# Patient Record
Sex: Female | Born: 1985 | Race: White | Hispanic: No | Marital: Married | Smoking: Former smoker
Health system: Southern US, Community
[De-identification: ages and names within clinical notes are randomized; demographics above are authoritative.]

## PROBLEM LIST (undated history)

## (undated) DIAGNOSIS — Z789 Other specified health status: Secondary | ICD-10-CM

## (undated) HISTORY — PX: NO PAST SURGERIES: SHX2092

---
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
## 2009-03-07 ENCOUNTER — Observation Stay: Payer: Self-pay

## 2009-03-07 ENCOUNTER — Inpatient Hospital Stay: Payer: Self-pay | Admitting: Obstetrics & Gynecology

## 2009-05-07 ENCOUNTER — Emergency Department: Payer: Self-pay | Admitting: Emergency Medicine

## 2011-05-14 ENCOUNTER — Ambulatory Visit: Payer: Self-pay | Admitting: Family Medicine

## 2011-07-23 ENCOUNTER — Encounter: Payer: Self-pay | Admitting: Family Medicine

## 2011-07-23 ENCOUNTER — Ambulatory Visit (INDEPENDENT_AMBULATORY_CARE_PROVIDER_SITE_OTHER): Payer: BC Managed Care – PPO | Admitting: Family Medicine

## 2011-07-23 VITALS — BP 100/62 | HR 80 | Temp 98.1°F | Ht 66.0 in | Wt 148.0 lb

## 2011-07-23 DIAGNOSIS — Z8669 Personal history of other diseases of the nervous system and sense organs: Secondary | ICD-10-CM

## 2011-07-23 DIAGNOSIS — F329 Major depressive disorder, single episode, unspecified: Secondary | ICD-10-CM

## 2011-07-23 DIAGNOSIS — F32A Depression, unspecified: Secondary | ICD-10-CM | POA: Insufficient documentation

## 2011-07-23 MED ORDER — BUPROPION HCL ER (XL) 150 MG PO TB24: 150.0000 mg | ORAL_TABLET | ORAL | Status: DC

## 2011-07-23 NOTE — Patient Instructions (Addendum)
Please keep a headache journal. Call me in one month to let me know how the Wellbutrin is working.

## 2011-07-23 NOTE — Progress Notes (Signed)
Subjective:    Patient ID: Brittany Ferguson, female    DOB: 03/14/1985, 26 y.o.   MRN: [id number]  HPI  63 G1P1 yo here to establish care.  Headaches- gets one to two severe headaches per month- behind eyes. Often associated with photophobia and nausea. Never told that she has migraines or any other type of headache.  Usually goes away if she takes some Ibuprofen and goes to sleep.  Depression- has had episodes of what she considers to be depression her entire life.  Has never seen a healthcare provider about them.  She does think she had the baby blues after she delivered her daughter- never wanted to harm herself or her daughter.  Lately, she has less desire to do things she likes to do, less motivated to get out of bed. She has been a little tearful. No anxiety. Sleeping ok. Appetite ok. No SI or HI.  She has been smoking. She and her husband would like to have another child soon.  Patient Active Problem List  Diagnosis  . Depression  . History of migraine headaches   No past medical history on file. No past surgical history on file. History  Substance Use Topics  . Smoking status: Current Everyday Smoker  . Smokeless tobacco: Not on file  . Alcohol Use: Not on file   Family History  Problem Relation Age of Onset  . Depression Mother   . Heart disease Father   . Hyperlipidemia Father   . Hypertension Father    Allergies  Allergen Reactions  . Penicillins Rash    As a child   Current Outpatient Prescriptions on File Prior to Visit  Medication Sig Dispense Refill  . fexofenadine (ALLEGRA) 180 MG tablet Take 180 mg by mouth daily.      Marland Kitchen buPROPion (WELLBUTRIN XL) 150 MG 24 hr tablet Take 1 tablet (150 mg total) by mouth every morning.  30 tablet  6   The PMH, PSH, Social History, Family History, Medications, and allergies have been reviewed in [facility], and have been updated if relevant.    Review of Systems See HPI Patient reports no  vision/ hearing  changes,anorexia, weight change, fever ,adenopathy, persistant / recurrent hoarseness, swallowing issues, chest pain, edema,persistant / recurrent cough, hemoptysis, abnormal bruising/bleeding, major joint swelling, breast masses or abnormal vaginal bleeding.       Objective:   Physical Exam BP 100/62  Pulse 80  Temp 98.1 F (36.7 C)  Ht 5\' 6"  (1.676 m)  Wt 148 lb (67.132 kg)  BMI 23.89 kg/m2  LMP 07/03/2011  General:  Well-developed,well-nourished,in no acute distress; alert,appropriate and cooperative throughout examination Head:  normocephalic and atraumatic.   Eyes:  vision grossly intact, pupils equal, pupils round, and pupils reactive to light.   Ears:  R ear normal and L ear normal.   Nose:  no external deformity.   Mouth:  good dentition.   Lungs:  Normal respiratory effort, chest expands symmetrically. Lungs are clear to auscultation, no crackles or wheezes. Heart:  Normal rate and regular rhythm. S1 and S2 normal without gallop, murmur, click, rub or other extra sounds. Abdomen:  Bowel sounds positive,abdomen soft and non-tender without masses, organomegaly or hernias noted. Msk:  No deformity or scoliosis noted of thoracic or lumbar spine.   Extremities:  No clubbing, cyanosis, edema, or deformity noted with normal full range of motion of all joints.   Neurologic:  alert & oriented X3 and gait normal.   Skin:  Intact without suspicious lesions  or rashes Cervical Nodes:  No lymphadenopathy noted Psych:  Cognition and judgment appear intact. Alert and cooperative with normal attention span and concentration. No apparent delusions, illusions, hallucinations    Assessment & Plan:   1. Depression  Deteriorated. Will start Wellbutrin XL 150 mg daily- discussed that it is a pregnancy risk category C. Hopefully this will help with smoking cessation as well.  Discussed how important it is to quit smoking before she gets pregnant. The patient indicates understanding of these  issues and agrees with the plan.   2. History of migraine headaches  Advised keeping headache journal- look for triggers. She wants to get pregnant so we cannot prescribe triptans. She is not having them frequently enough at this point to warrant prophylactic therapy. The patient indicates understanding of these issues and agrees with the plan.

## 2012-02-02 ENCOUNTER — Encounter: Payer: Self-pay | Admitting: Family Medicine

## 2012-02-02 ENCOUNTER — Ambulatory Visit (INDEPENDENT_AMBULATORY_CARE_PROVIDER_SITE_OTHER): Payer: BC Managed Care – PPO | Admitting: Family Medicine

## 2012-02-02 VITALS — BP 110/60 | HR 86 | Temp 98.2°F | Ht 66.0 in | Wt 142.8 lb

## 2012-02-02 DIAGNOSIS — J020 Streptococcal pharyngitis: Secondary | ICD-10-CM

## 2012-02-02 DIAGNOSIS — J029 Acute pharyngitis, unspecified: Secondary | ICD-10-CM

## 2012-02-02 MED ORDER — AZITHROMYCIN 250 MG PO TABS: ORAL_TABLET | ORAL | Status: DC

## 2012-02-02 NOTE — Progress Notes (Signed)
Nature conservation officer at [facility] [address] [place] [address] Phone: [phone] Fax: [phone]  Date:  02/02/2012   Name:  Brittany Ferguson   DOB:  [birth year]   MRN:  [id number] Gender: female Age: 27 y.o.  Primary Physician:  Ruthe Mannan, MD  Evaluating MD: Hannah Beat, MD   Chief Complaint: Sore Throat, Headache, Fever, Chills and Generalized Body Aches   History of Present Illness:  Brittany Ferguson is a 27 y.o. pleasant patient who presents with the following:  Throat has been hurting her and feels hot and is achy. No real coughing right now. Throat started to hurt on Monday.  No fever, no chills or sweats. No nasal congestion or cough. Some nausea.  Patient Active Problem List  Diagnosis  . Depression  . History of migraine headaches    No past medical history on file.  No past surgical history on file.  History  Substance Use Topics  . Smoking status: Current Every Day Smoker  . Smokeless tobacco: Not on file  . Alcohol Use: Not on file    Family History  Problem Relation Age of Onset  . Depression Mother   . Heart disease Father   . Hyperlipidemia Father   . Hypertension Father     Allergies  Allergen Reactions  . Penicillins Rash    As a child    Medication list has been reviewed and updated.  Outpatient Prescriptions Prior to Visit  Medication Sig Dispense Refill  . buPROPion (WELLBUTRIN XL) 150 MG 24 hr tablet Take 1 tablet (150 mg total) by mouth every morning.  30 tablet  6  . fexofenadine (ALLEGRA) 180 MG tablet Take 180 mg by mouth daily.       Last reviewed on 02/02/2012 11:31 AM by Consuello Masse, CMA  Review of Systems:  ROS: GEN: Acute illness details above GI: Tolerating PO intake GU: maintaining adequate hydration and urination Pulm: No SOB Interactive and getting along well at home.  Otherwise, ROS is as per the HPI.   Physical Examination: BP 110/60  Pulse 86  Temp 98.2 F (36.8 C) (Oral)  Ht 5'  6" (1.676 m)  Wt 142 lb 12 oz (64.751 kg)  BMI 23.04 kg/m2  SpO2 97%  Ideal Body Weight: Weight in (lb) to have BMI = 25: 154.6    Gen: WDWN, NAD; A & O x3, cooperative. Pleasant.Globally Non-toxic HEENT: Normocephalic and atraumatic. Throat: swollen tonsills without exudate R TM clear, L TM - good landmarks, No fluid present. rhinnorhea. No frontal or maxillary sinus T. MMM NECK: Anterior cervical  LAD is present - TTP CV: RRR, No M/G/R, cap refill <2 sec PULM: Breathing comfortably in no respiratory distress. no wheezing, crackles, rhonchi ABD: S,NT,ND,+BS. No HSM. No rebound. EXT: No c/c/e PSYCH: Friendly, good eye contact   Assessment and Plan:  1. Strep throat    2. Sore throat  POCT rapid strep A   PCN allergic  Results for orders placed in visit on 02/02/12  POCT RAPID STREP A (OFFICE)      Component Value Range   Rapid Strep A Screen Positive (*) Negative     Orders Today:  Orders Placed This Encounter  Procedures  . POCT rapid strep A    Updated Medication List: (Includes new medications, updates to list, dose adjustments) Meds ordered this encounter  Medications  . azithromycin (ZITHROMAX) 250 MG tablet    Sig: 2 tabs po on day 1, then 1 tab po  for 4 days    Dispense:  6 tablet    Refill:  0    Medications Discontinued: There are no discontinued medications.   Signed, Elpidio Galea. Seidy Labreck, MD 02/02/2012 12:03 PM

## 2012-06-08 ENCOUNTER — Encounter: Payer: Self-pay | Admitting: Family Medicine

## 2012-06-08 ENCOUNTER — Ambulatory Visit (INDEPENDENT_AMBULATORY_CARE_PROVIDER_SITE_OTHER): Payer: BC Managed Care – PPO | Admitting: Family Medicine

## 2012-06-08 VITALS — BP 110/70 | HR 88 | Temp 97.9°F | Wt 142.0 lb

## 2012-06-08 DIAGNOSIS — J029 Acute pharyngitis, unspecified: Secondary | ICD-10-CM

## 2012-06-08 DIAGNOSIS — J069 Acute upper respiratory infection, unspecified: Secondary | ICD-10-CM

## 2012-06-08 NOTE — Patient Instructions (Addendum)
Good to see you.  Treat sympotmatically with Mucinex, nasal saline irrigation, and Tylenol/Ibuprofen.   Also try an antihistamine claritin or zyrtec.   Call if not improving as expected in 5-7 days.

## 2012-06-08 NOTE — Progress Notes (Signed)
SUBJECTIVE:  Brittany Ferguson is a 27 y.o. female who complains of coryza, congestion, sneezing and sore throat for 2 days. She denies a history of anorexia, chest pain and chills and denies a history of asthma. Patient does smoke cigarettes.  Taking Mucinex OTC. Patient Active Problem List   Diagnosis Date Noted  . Depression 07/23/2011  . History of migraine headaches 07/23/2011   No past medical history on file. No past surgical history on file. History  Substance Use Topics  . Smoking status: Current Every Day Smoker  . Smokeless tobacco: Not on file  . Alcohol Use: Not on file   Family History  Problem Relation Age of Onset  . Depression Mother   . Heart disease Father   . Hyperlipidemia Father   . Hypertension Father    Allergies  Allergen Reactions  . Penicillins Rash    As a child   Current Outpatient Prescriptions on File Prior to Visit  Medication Sig Dispense Refill  . buPROPion (WELLBUTRIN XL) 150 MG 24 hr tablet Take 1 tablet (150 mg total) by mouth every morning.  30 tablet  6  . fexofenadine (ALLEGRA) 180 MG tablet Take 180 mg by mouth daily.       No current facility-administered medications on file prior to visit.   The PMH, PSH, Social History, Family History, Medications, and allergies have been reviewed in [facility], and have been updated if relevant.    OBJECTIVE: BP 110/70  Pulse 88  Temp(Src) 97.9 F (36.6 C)  Wt 142 lb (64.411 kg)  BMI 22.93 kg/m2  She appears well, vital signs are as noted. Ears normal.  Throat and pharynx normal.  Neck supple. No adenopathy in the neck. Nose is congested. Sinuses non tender. The chest is clear, without wheezes or rales.  ASSESSMENT:  Rapid strep neg. viral upper respiratory illness  PLAN: Symptomatic therapy suggested: push fluids, rest and return office visit prn if symptoms persist or worsen. Lack of antibiotic effectiveness discussed with her. Call or return to clinic prn if these symptoms worsen or fail to  improve as anticipated.

## 2012-06-08 NOTE — Addendum Note (Signed)
Addended by: Eliezer Bottom on: 06/08/2012 01:16 PM   Modules accepted: Orders

## 2013-11-16 ENCOUNTER — Ambulatory Visit: Payer: BC Managed Care – PPO | Admitting: Family Medicine

## 2014-01-11 NOTE — L&D Delivery Note (Signed)
Delivery Note At 3:03 PM a viable and healthy female infant "Brittany Ferguson" was delivered via Vaginal, Spontaneous Delivery (Presentation: Left Occiput Anterior).  APGAR: 8, 9; weight 7 lb 14.6 oz (3590 g).  Loose nuchal cord x1, delivered through. Placenta status: Intact, Spontaneous.  Cord: 3 vessels with the following complications: None.  Cord pH: pending  Anesthesia: Epidural  Episiotomy: None Lacerations: 1st degree Suture Repair: 3.0 vicryl Est. Blood Loss (mL): 300  Mom to postpartum.  Baby to Couplet care / Skin to Skin with vigorous crying immediately.  Cord gas collected for bradycardia at the final moments of pushing.   Christeen Douglas 08/04/2014, 3:27 PM

## 2014-01-30 LAB — OB RESULTS CONSOLE GC/CHLAMYDIA
CHLAMYDIA, DNA PROBE: NEGATIVE
GC PROBE AMP, GENITAL: NEGATIVE

## 2014-01-30 LAB — OB RESULTS CONSOLE PLATELET COUNT: Platelets: 278 10*3/uL

## 2014-01-30 LAB — OB RESULTS CONSOLE HEPATITIS B SURFACE ANTIGEN: Hepatitis B Surface Ag: NEGATIVE

## 2014-01-30 LAB — OB RESULTS CONSOLE RPR: RPR: NONREACTIVE

## 2014-01-30 LAB — OB RESULTS CONSOLE RUBELLA ANTIBODY, IGM: RUBELLA: IMMUNE

## 2014-01-30 LAB — OB RESULTS CONSOLE HGB/HCT, BLOOD
HEMATOCRIT: 36 %
HEMOGLOBIN: 12 g/dL

## 2014-01-30 LAB — OB RESULTS CONSOLE HIV ANTIBODY (ROUTINE TESTING): HIV: NONREACTIVE

## 2014-01-30 LAB — OB RESULTS CONSOLE VARICELLA ZOSTER ANTIBODY, IGG: Varicella: IMMUNE

## 2014-07-10 LAB — OB RESULTS CONSOLE GBS: GBS: NEGATIVE

## 2014-08-03 ENCOUNTER — Encounter: Payer: Self-pay | Admitting: *Deleted

## 2014-08-03 ENCOUNTER — Inpatient Hospital Stay
Admission: EM | Admit: 2014-08-03 | Discharge: 2014-08-06 | DRG: 775 | Disposition: A | Discharge status: Home / Self Care | Payer: BLUE CROSS/BLUE SHIELD | Attending: Obstetrics and Gynecology | Admitting: Obstetrics and Gynecology

## 2014-08-03 DIAGNOSIS — Z809 Family history of malignant neoplasm, unspecified: Secondary | ICD-10-CM | POA: Diagnosis not present

## 2014-08-03 DIAGNOSIS — F17210 Nicotine dependence, cigarettes, uncomplicated: Secondary | ICD-10-CM | POA: Diagnosis present

## 2014-08-03 DIAGNOSIS — Z8249 Family history of ischemic heart disease and other diseases of the circulatory system: Secondary | ICD-10-CM | POA: Diagnosis not present

## 2014-08-03 DIAGNOSIS — O99333 Smoking (tobacco) complicating pregnancy, third trimester: Secondary | ICD-10-CM | POA: Diagnosis present

## 2014-08-03 DIAGNOSIS — Z3493 Encounter for supervision of normal pregnancy, unspecified, third trimester: Secondary | ICD-10-CM | POA: Diagnosis present

## 2014-08-03 DIAGNOSIS — Z3A40 40 weeks gestation of pregnancy: Secondary | ICD-10-CM | POA: Diagnosis present

## 2014-08-03 HISTORY — DX: Other specified health status: Z78.9

## 2014-08-03 LAB — CBC
HEMATOCRIT: 28.1 % — AB (ref 35.0–47.0)
Hemoglobin: 9.1 g/dL — ABNORMAL LOW (ref 12.0–16.0)
MCH: 26.9 pg (ref 26.0–34.0)
MCHC: 32.3 g/dL (ref 32.0–36.0)
MCV: 83.5 fL (ref 80.0–100.0)
PLATELETS: 305 10*3/uL (ref 150–440)
RBC: 3.36 MIL/uL — AB (ref 3.80–5.20)
RDW: 15.1 % — AB (ref 11.5–14.5)
WBC: 11.2 10*3/uL — ABNORMAL HIGH (ref 3.6–11.0)

## 2014-08-03 LAB — ABO/RH: ABO/RH(D): A POS

## 2014-08-03 LAB — TYPE AND SCREEN
ABO/RH(D): A POS
Antibody Screen: NEGATIVE

## 2014-08-03 MED ORDER — BUTORPHANOL TARTRATE 1 MG/ML IJ SOLN
1.0000 mg | INTRAMUSCULAR | Status: DC | PRN
  Administered 2014-08-04: 1 mg via INTRAVENOUS
  Filled 2014-08-03: qty 1

## 2014-08-03 MED ORDER — LACTATED RINGERS IV SOLN: 500.0000 mL | INTRAVENOUS | Status: DC | PRN

## 2014-08-03 MED ORDER — DINOPROSTONE 10 MG VA INST
10.0000 mg | VAGINAL_INSERT | Freq: Once | VAGINAL | Status: AC
  Administered 2014-08-03: 10 mg via VAGINAL
  Filled 2014-08-03: qty 1

## 2014-08-03 MED ORDER — TERBUTALINE SULFATE 1 MG/ML IJ SOLN: 0.2500 mg | Freq: Once | INTRAMUSCULAR | Status: AC | PRN

## 2014-08-03 MED ORDER — LACTATED RINGERS IV SOLN
INTRAVENOUS | Status: DC
  Administered 2014-08-03: 21:00:00 via INTRAVENOUS

## 2014-08-04 ENCOUNTER — Inpatient Hospital Stay: Payer: BLUE CROSS/BLUE SHIELD | Admitting: Anesthesiology

## 2014-08-04 ENCOUNTER — Inpatient Hospital Stay: Admit: 2014-08-04 | Payer: BLUE CROSS/BLUE SHIELD

## 2014-08-04 ENCOUNTER — Encounter: Payer: Self-pay | Admitting: *Deleted

## 2014-08-04 LAB — CORD BLOOD GAS (ARTERIAL)
Acid-base deficit: 5.2 mmol/L — ABNORMAL HIGH (ref 0.0–2.0)
BICARBONATE: 24.1 meq/L (ref 21.0–28.0)
pCO2 cord blood (arterial): 63 mmHg — ABNORMAL HIGH (ref 42.0–56.0)
pH cord blood (arterial): 7.19 — CL (ref 7.210–7.380)

## 2014-08-04 MED ORDER — ZOLPIDEM TARTRATE 5 MG PO TABS: 5.0000 mg | ORAL_TABLET | Freq: Every evening | ORAL | Status: DC | PRN

## 2014-08-04 MED ORDER — LANOLIN HYDROUS EX OINT: TOPICAL_OINTMENT | CUTANEOUS | Status: DC | PRN

## 2014-08-04 MED ORDER — PRENATAL MULTIVITAMIN CH
1.0000 | ORAL_TABLET | Freq: Every day | ORAL | Status: DC
  Administered 2014-08-05: 1 via ORAL
  Filled 2014-08-04: qty 1

## 2014-08-04 MED ORDER — ONDANSETRON HCL 4 MG PO TABS: 4.0000 mg | ORAL_TABLET | ORAL | Status: DC | PRN

## 2014-08-04 MED ORDER — BENZOCAINE-MENTHOL 20-0.5 % EX AERO: 1.0000 "application " | INHALATION_SPRAY | CUTANEOUS | Status: DC | PRN

## 2014-08-04 MED ORDER — BISACODYL 10 MG RE SUPP: 10.0000 mg | Freq: Every day | RECTAL | Status: DC | PRN

## 2014-08-04 MED ORDER — BUPROPION HCL ER (XL) 150 MG PO TB24
150.0000 mg | ORAL_TABLET | ORAL | Status: DC
  Filled 2014-08-04 (×3): qty 1

## 2014-08-04 MED ORDER — FLEET ENEMA 7-19 GM/118ML RE ENEM: 1.0000 | ENEMA | Freq: Every day | RECTAL | Status: DC | PRN

## 2014-08-04 MED ORDER — OXYTOCIN 40 UNITS IN LACTATED RINGERS INFUSION - SIMPLE MED: 62.5000 mL/h | INTRAVENOUS | Status: DC | PRN

## 2014-08-04 MED ORDER — IBUPROFEN 600 MG PO TABS
600.0000 mg | ORAL_TABLET | Freq: Four times a day (QID) | ORAL | Status: DC
  Administered 2014-08-04 – 2014-08-06 (×7): 600 mg via ORAL
  Filled 2014-08-04 (×7): qty 1

## 2014-08-04 MED ORDER — DIBUCAINE 1 % RE OINT: 1.0000 "application " | TOPICAL_OINTMENT | RECTAL | Status: DC | PRN

## 2014-08-04 MED ORDER — TERBUTALINE SULFATE 1 MG/ML IJ SOLN
0.2500 mg | Freq: Once | INTRAMUSCULAR | Status: AC | PRN
  Filled 2014-08-04: qty 1

## 2014-08-04 MED ORDER — ACETAMINOPHEN 325 MG PO TABS: 650.0000 mg | ORAL_TABLET | ORAL | Status: DC | PRN

## 2014-08-04 MED ORDER — LORATADINE 10 MG PO TABS
10.0000 mg | ORAL_TABLET | Freq: Every day | ORAL | Status: DC
  Filled 2014-08-04: qty 1

## 2014-08-04 MED ORDER — SIMETHICONE 80 MG PO CHEW: 80.0000 mg | CHEWABLE_TABLET | ORAL | Status: DC | PRN

## 2014-08-04 MED ORDER — OXYTOCIN 40 UNITS IN LACTATED RINGERS INFUSION - SIMPLE MED
1.0000 m[IU]/min | INTRAVENOUS | Status: DC
  Administered 2014-08-04: 1 m[IU]/min via INTRAVENOUS

## 2014-08-04 MED ORDER — OXYCODONE-ACETAMINOPHEN 5-325 MG PO TABS: 2.0000 | ORAL_TABLET | ORAL | Status: DC | PRN

## 2014-08-04 MED ORDER — OXYTOCIN 40 UNITS IN LACTATED RINGERS INFUSION - SIMPLE MED
INTRAVENOUS | Status: AC
  Administered 2014-08-04: 40 [IU] via INTRAVENOUS
  Filled 2014-08-04: qty 1000

## 2014-08-04 MED ORDER — OXYTOCIN 40 UNITS IN LACTATED RINGERS INFUSION - SIMPLE MED
62.5000 mL/h | INTRAVENOUS | Status: DC
  Administered 2014-08-04: 40 [IU] via INTRAVENOUS

## 2014-08-04 MED ORDER — SODIUM CHLORIDE 0.9 % IJ SOLN: 3.0000 mL | Freq: Two times a day (BID) | INTRAMUSCULAR | Status: DC

## 2014-08-04 MED ORDER — SENNOSIDES-DOCUSATE SODIUM 8.6-50 MG PO TABS
2.0000 | ORAL_TABLET | ORAL | Status: DC
  Administered 2014-08-06: 2 via ORAL
  Filled 2014-08-04: qty 2

## 2014-08-04 MED ORDER — SODIUM CHLORIDE 0.9 % IV SOLN: 250.0000 mL | INTRAVENOUS | Status: DC | PRN

## 2014-08-04 MED ORDER — MEASLES, MUMPS & RUBELLA VAC ~~LOC~~ INJ
0.5000 mL | INJECTION | Freq: Once | SUBCUTANEOUS | Status: DC
  Filled 2014-08-04: qty 0.5

## 2014-08-04 MED ORDER — DIPHENHYDRAMINE HCL 50 MG/ML IJ SOLN: 12.5000 mg | INTRAMUSCULAR | Status: DC | PRN

## 2014-08-04 MED ORDER — DIPHENHYDRAMINE HCL 25 MG PO CAPS: 25.0000 mg | ORAL_CAPSULE | Freq: Four times a day (QID) | ORAL | Status: DC | PRN

## 2014-08-04 MED ORDER — ONDANSETRON HCL 4 MG/2ML IJ SOLN: 4.0000 mg | INTRAMUSCULAR | Status: DC | PRN

## 2014-08-04 MED ORDER — EPHEDRINE 5 MG/ML INJ
10.0000 mg | INTRAVENOUS | Status: DC | PRN
Start: 2014-08-04 — End: 2014-08-05
  Filled 2014-08-04: qty 2

## 2014-08-04 MED ORDER — WITCH HAZEL-GLYCERIN EX PADS: 1.0000 "application " | MEDICATED_PAD | CUTANEOUS | Status: DC | PRN

## 2014-08-04 MED ORDER — BUPIVACAINE HCL (PF) 0.25 % IJ SOLN
INTRAMUSCULAR | Status: DC | PRN
  Administered 2014-08-04: 5 mL

## 2014-08-04 MED ORDER — PHENYLEPHRINE 40 MCG/ML (10ML) SYRINGE FOR IV PUSH (FOR BLOOD PRESSURE SUPPORT)
80.0000 ug | PREFILLED_SYRINGE | INTRAVENOUS | Status: DC | PRN
  Filled 2014-08-04: qty 2

## 2014-08-04 MED ORDER — SODIUM CHLORIDE 0.9 % IJ SOLN: 3.0000 mL | INTRAMUSCULAR | Status: DC | PRN

## 2014-08-04 MED ORDER — TETANUS-DIPHTH-ACELL PERTUSSIS 5-2.5-18.5 LF-MCG/0.5 IM SUSP: 0.5000 mL | Freq: Once | INTRAMUSCULAR | Status: DC

## 2014-08-04 MED ORDER — OXYCODONE-ACETAMINOPHEN 5-325 MG PO TABS: 1.0000 | ORAL_TABLET | ORAL | Status: DC | PRN

## 2014-08-04 MED ORDER — FENTANYL 2.5 MCG/ML W/ROPIVACAINE 0.2% IN NS 100 ML EPIDURAL INFUSION (ARMC-ANES)
9.0000 mL/h | EPIDURAL | Status: DC
  Administered 2014-08-04: 9 mL/h via EPIDURAL

## 2014-08-04 MED ORDER — FENTANYL 2.5 MCG/ML W/ROPIVACAINE 0.2% IN NS 100 ML EPIDURAL INFUSION (ARMC-ANES)
EPIDURAL | Status: AC
  Filled 2014-08-04: qty 100

## 2014-08-04 NOTE — Anesthesia Procedure Notes (Signed)
Epidural Patient location during procedure: OB  Staffing Anesthesiologist: Berdine Addison Performed by: anesthesiologist   Preanesthetic Checklist Completed: patient identified, site marked, surgical consent, pre-op evaluation, timeout performed, IV checked, risks and benefits discussed and monitors and equipment checked  Epidural Patient position: sitting Prep: Betadine Patient monitoring: heart rate, continuous pulse ox and blood pressure Approach: midline Location: L4-L5 Injection technique: LOR saline  Needle:  Needle type: Tuohy  Needle gauge: 18 G Needle length: 9 cm and 9 Catheter type: closed end flexible Catheter size: 20 Guage Test dose: negative and 1.5% lidocaine with Epi 1:200 K  Assessment Sensory level: T10 Events: blood not aspirated, injection not painful, no injection resistance, negative IV test and no paresthesia  Additional Notes   Patient tolerated the insertion well without complications. 1103 catheter. In. 1104 test dose. 1106 bolus. 1110 infusion.Reason for block:procedure for pain

## 2014-08-04 NOTE — Anesthesia Preprocedure Evaluation (Addendum)
Anesthesia Evaluation  Patient identified by MRN, date of birth, ID band Patient awake    Reviewed: Allergy & Precautions, NPO status , Patient's Chart, lab work & pertinent test results, reviewed documented beta blocker date and time   Airway Mallampati: II  TM Distance: >3 FB     Dental  (+) Chipped   Pulmonary Current Smoker,          Cardiovascular     Neuro/Psych Depression    GI/Hepatic   Endo/Other    Renal/GU      Musculoskeletal   Abdominal   Peds  Hematology   Anesthesia Other Findings   Reproductive/Obstetrics                            Anesthesia Physical Anesthesia Plan  ASA: II  Anesthesia Plan: Epidural   Post-op Pain Management:    Induction:   Airway Management Planned:   Additional Equipment:   Intra-op Plan:   Post-operative Plan:   Informed Consent: I have reviewed the patients History and Physical, chart, labs and discussed the procedure including the risks, benefits and alternatives for the proposed anesthesia with the patient or authorized representative who has indicated his/her understanding and acceptance.     Plan Discussed with:   Anesthesia Plan Comments:         Anesthesia Quick Evaluation

## 2014-08-04 NOTE — Progress Notes (Signed)
Patient ID: Brittany Ferguson, female   DOB: December 24, 1985, 29 y.o.   MRN: [id number]  Cervix: 10/100/+2. Will begin pushing

## 2014-08-04 NOTE — H&P (Addendum)
Brittany Ferguson is a 29 y.o. female presenting for elective induction of labor at 40+0wks . Maternal Medical History:  Reason for admission: Induction of labor at term  Fetal activity: Perceived fetal activity is normal.   Last perceived fetal movement was within the past hour.    Prenatal complications: no prenatal complications   OB History    Gravida Para Term Preterm AB TAB SAB Ectopic Multiple Living   0 0     2     Past Medical History  Diagnosis Date  . Medical history non-contributory    Past Surgical History  Procedure Laterality Date  . No past surgeries     Family History: family history includes Cancer in her maternal grandmother and paternal grandmother; Depression in her mother; Heart disease in her father; Hyperlipidemia in her father; Hypertension in her father. Social History:  reports that she has been smoking Cigarettes.  She has been smoking about 0.25 packs per day. She has never used smokeless tobacco. She reports that she does not drink alcohol or use illicit drugs.   Prenatal Transfer Tool  Maternal Diabetes: No Genetic Screening: Declined Maternal Ultrasounds/Referrals: Abnormal:  Findings:   Other: low lying placenta at early scan that resolved by 28wks Fetal Ultrasounds or other Referrals:  None Maternal Substance Abuse:  No Significant Maternal Medications:  None Significant Maternal Lab Results:  None Other Comments:  None  Review of Systems  Constitutional: Negative for fever and chills.  Eyes: Negative for blurred vision and double vision.  Respiratory: Negative for shortness of breath.   Cardiovascular: Negative for chest pain and palpitations.  Gastrointestinal: Negative for vomiting, abdominal pain, diarrhea and constipation.  Genitourinary: Negative for dysuria, urgency, frequency and flank pain.  Neurological: Negative for headaches.  Psychiatric/Behavioral: Negative for depression.   Prior RN exam:  Dilation:  Closed Effacement (%): 40 Station: -2 Exam by:: L Patterson RNC (ex os 3 cm, internal os closed.) Blood pressure 111/60, pulse 76, temperature 98.1 F (36.7 C), temperature source Oral, resp. rate 16.   Current cervical exam Dilation: 3 Effacement: 40 Station: -1 Midposition, moderately firm  Maternal Exam:  Uterine Assessment: Contraction strength is mild.  Abdomen: Patient reports no abdominal tenderness. Estimated fetal weight is 8lbs.   Fetal presentation: vertex  Introitus: Normal vulva. Normal vagina.  Pelvis: adequate for delivery.   Cervix: Cervix evaluated by digital exam.     Fetal Exam Fetal State Assessment: Category I - tracings are normal.     Physical Exam  Constitutional: She is oriented to person, place, and time. She appears well-developed and well-nourished. No distress.  Eyes: No scleral icterus.  Neck: Normal range of motion. Neck supple.  Cardiovascular: Normal rate.   Respiratory: Effort normal. No respiratory distress.  GI: Soft. She exhibits no distension. There is no tenderness.  Genitourinary: Vagina normal and uterus normal.  Musculoskeletal: Normal range of motion.  Neurological: She is alert and oriented to person, place, and time.  Skin: Skin is warm and dry.  Psychiatric: She has a normal mood and affect.    Prenatal labs: ABO, Rh: --/--/A POS (07/23 2112) Antibody: NEG (07/23 2105) Rubella: Immune (01/20 0000) RPR: Nonreactive (01/20 0000)  HBsAg: Negative (01/20 0000)  HIV: Non-reactive (01/20 0000)  GBS: Negative (06/29 0448)   Assessment/Plan: Cervidil x12 hrs. Start pitocin titration to fetal tolerance or 3-4 contractions x10 min.  Epidural when desired. Expect vaginal delivery  Christeen Douglas 08/04/2014, 7:25 AM

## 2014-08-04 NOTE — Progress Notes (Signed)
Brittany Ferguson is a 29 y.o. G2P1002 at 40+0  Subjective: Comfortable with epidural  Objective: BP 111/60 mmHg  Pulse 76  Temp(Src) 98.2 F (36.8 C) (Oral)  Resp 18  SpO2 100%      FHT:  Moderate variability, +accels, +rare lates and occasional variables, 110-120 baseline UC:   regular, every 2-3 minutes SVE:   Dilation: 8 Effacement (%): 90 Station: -2 Exam by:: RDP  Labs: Lab Results  Component Value Date   WBC 11.2* 08/03/2014   HGB 9.1* 08/03/2014   HCT 28.1* 08/03/2014   MCV 83.5 08/03/2014   PLT 305 08/03/2014    Assessment / Plan: Induction of labor due to elective,  progressing well on pitocin  Labor: Progressing normally and with pitocin Fetal Wellbeing:  Category II - maternal repositioning and O2 applied Pain Control:  Epidural Anticipated MOD:  NSVD  Adlyn Fife 08/04/2014, 2:28 PM

## 2014-08-05 LAB — CBC
HCT: 25.9 % — ABNORMAL LOW (ref 35.0–47.0)
Hemoglobin: 8.4 g/dL — ABNORMAL LOW (ref 12.0–16.0)
MCH: 27 pg (ref 26.0–34.0)
MCHC: 32.4 g/dL (ref 32.0–36.0)
MCV: 83.4 fL (ref 80.0–100.0)
Platelets: 269 10*3/uL (ref 150–440)
RBC: 3.11 MIL/uL — ABNORMAL LOW (ref 3.80–5.20)
RDW: 15.4 % — ABNORMAL HIGH (ref 11.5–14.5)
WBC: 14 10*3/uL — AB (ref 3.6–11.0)

## 2014-08-05 LAB — RPR: RPR: NONREACTIVE

## 2014-08-05 NOTE — Progress Notes (Signed)
Post Partum Day 1 Subjective: no complaints, up ad lib, voiding and tolerating PO  Objective: Blood pressure 112/71, pulse 83, temperature 98 F (36.7 C), temperature source Oral, resp. rate 18, SpO2 99 %, currently breastfeeding.  Physical Exam:  General: alert, cooperative and no distress Lochia: appropriate Uterine Fundus: firm DVT Evaluation: No evidence of DVT seen on physical exam.   Recent Labs  08/03/14 2105 08/05/14 0543  HGB 9.1* 8.4*  HCT 28.1* 25.9*    Assessment/Plan: Plan for discharge tomorrow, Breastfeeding and Lactation consult   LOS: 2 days   Brittany Ferguson 08/05/2014, 7:35 AM

## 2014-08-05 NOTE — Anesthesia Postprocedure Evaluation (Signed)
  Anesthesia Post-op Note  Patient: Brittany Ferguson  Procedure(s) Performed: * No procedures listed *  Anesthesia type:Epidural  Patient location: PACU  Post pain: Pain level controlled  Post assessment: Post-op Vital signs reviewed, Patient's Cardiovascular Status Stable, Respiratory Function Stable, Patent Airway and No signs of Nausea or vomiting  Post vital signs: Reviewed and stable  Last Vitals:  Filed Vitals:   08/05/14 0359  BP: 112/71  Pulse: 83  Temp: 36.7 C  Resp:     Level of consciousness: awake, alert  and patient cooperative  Complications: No apparent anesthesia complications

## 2014-08-06 MED ORDER — IBUPROFEN 600 MG PO TABS
600.0000 mg | ORAL_TABLET | Freq: Four times a day (QID) | ORAL | Status: DC
Start: 2014-08-06 — End: 2019-06-04

## 2014-08-06 NOTE — Discharge Instructions (Signed)
Care After Vaginal Delivery °Congratulations on your new baby!! ° °Refer to this sheet in the next few weeks. These discharge instructions provide you with information on caring for yourself after delivery. Your caregiver may also give you specific instructions. Your treatment has been planned according to the most current medical practices available, but problems sometimes occur. Call your caregiver if you have any problems or questions after you go home. ° °HOME CARE INSTRUCTIONS °· Take over-the-counter or prescription medicines only as directed by your caregiver or pharmacist. °· Do not drink alcohol, especially if you are breastfeeding or taking medicine to relieve pain. °· Do not chew or smoke tobacco. °· Do not use illegal drugs. °· Continue to use good perineal care. Good perineal care includes: °¨ Wiping your perineum from front to back. °¨ Keeping your perineum clean. °· Do not use tampons or douche until your caregiver says it is okay. °· Shower, wash your hair, and take tub baths as directed by your caregiver. °· Wear a well-fitting bra that provides breast support. °· Eat healthy foods. °· Drink enough fluids to keep your urine clear or pale yellow. °· Eat high-fiber foods such as whole grain cereals and breads, brown rice, beans, and fresh fruits and vegetables every day. These foods may help prevent or relieve constipation. °· Follow your caregiver's recommendations regarding resumption of activities such as climbing stairs, driving, lifting, exercising, or traveling. Specifically, no driving for two weeks, so that you are comfortable reacting quickly in an emergency. °· Talk to your caregiver about resuming sexual activities. Resumption of sexual activities is dependent upon your risk of infection, your rate of healing, and your comfort and desire to resume sexual activity. Usually we recommend waiting about six weeks, or until your bleeding stops and you are interested in sex. °· Try to have someone  help you with your household activities and your newborn for at least a few days after you leave the hospital. Even longer is better. °· Rest as much as possible. Try to rest or take a nap when your newborn is sleeping. Sleep deprivation can be very hard after delivery. °· Increase your activities gradually. °· Keep all of your scheduled postpartum appointments. It is very important to keep your scheduled follow-up appointments. At these appointments, your caregiver will be checking to make sure that you are healing physically and emotionally. ° °SEEK MEDICAL CARE IF:  °· You are passing large clots from your vagina.  °· You have a foul smelling discharge from your vagina. °· You have trouble urinating. °· You are urinating frequently. °· You have pain when you urinate. °· You have a change in your bowel movements. °· You have increasing redness, pain, or swelling near your vaginal incision (episiotomy) or vaginal tear. °· You have pus draining from your episiotomy or vaginal tear. °· Your episiotomy or vaginal tear is separating. °· You have painful, hard, or reddened breasts. °· You have a severe headache. °· You have blurred vision or see spots. °· You feel sad or depressed. °· You have thoughts of hurting yourself or your newborn. °· You have questions about your care, the care of your newborn, or medicines. °· You are dizzy or light-headed. °· You have a rash. °· You have nausea or vomiting. °· You were breastfeeding and have not had a menstrual period within 12 weeks after you stopped breastfeeding. °· You are not breastfeeding and have not had a menstrual period by the 12th week after delivery. °· You   have a fever.  SEEK IMMEDIATE MEDICAL CARE IF:   You have persistent pain.  You have chest pain.  You have shortness of breath.  You faint.  You have leg pain.  You have stomach pain.  Your vaginal bleeding saturates two or more sanitary pads in 1 hour.  MAKE SURE YOU:   Understand these  instructions.  Will get help right away if you are not doing well or get worse.   Document Released: 12/26/1999 Document Revised: 05/14/2013 Document Reviewed: 08/25/2011  [facility] Patient Information 2015 [place], [place]. This information is not intended to replace advice given to you by your health care provider. Make sure you discuss any questions you have with your health care provider.  Breastfeeding Deciding to breastfeed is one of the best choices you can make for you and your baby. A change in hormones during pregnancy causes your breast tissue to grow and increases the number and size of your milk ducts. These hormones also allow proteins, sugars, and fats from your blood supply to make breast milk in your milk-producing glands. Hormones prevent breast milk from being released before your baby is born as well as prompt milk flow after birth. Once breastfeeding has begun, thoughts of your baby, as well as his or her sucking or crying, can stimulate the release of milk from your milk-producing glands.  BENEFITS OF BREASTFEEDING For Your Baby Your first milk (colostrum) helps your baby's digestive system function better.  There are antibodies in your milk that help your baby fight off infections.  Your baby has a lower incidence of asthma, allergies, and sudden infant death syndrome.  The nutrients in breast milk are better for your baby than infant formulas and are designed uniquely for your baby's needs.  Breast milk improves your baby's brain development.  Your baby is less likely to develop other conditions, such as childhood obesity, asthma, or type 2 diabetes mellitus.  For You  Breastfeeding helps to create a very special bond between you and your baby.  Breastfeeding is convenient. Breast milk is always available at the correct temperature and costs nothing.  Breastfeeding helps to burn calories and helps you lose the weight gained during pregnancy.  Breastfeeding makes  your uterus contract to its prepregnancy size faster and slows bleeding (lochia) after you give birth.  Breastfeeding helps to lower your risk of developing type 2 diabetes mellitus, osteoporosis, and breast or ovarian cancer later in life. SIGNS THAT YOUR BABY IS HUNGRY Early Signs of Hunger Increased alertness or activity. Stretching. Movement of the head from side to side. Movement of the head and opening of the mouth when the corner of the mouth or cheek is stroked (rooting). Increased sucking sounds, smacking lips, cooing, sighing, or squeaking. Hand-to-mouth movements. Increased sucking of fingers or hands. Late Signs of Hunger Fussing. Intermittent crying. Extreme Signs of Hunger Signs of extreme hunger will require calming and consoling before your baby will be able to breastfeed successfully. Do not wait for the following signs of extreme hunger to occur before you initiate breastfeeding:  Restlessness. A loud, strong cry.  Screaming. BREASTFEEDING BASICS Breastfeeding Initiation Find a comfortable place to sit or lie down, with your neck and back well supported. Place a pillow or rolled up blanket under your baby to bring him or her to the level of your breast (if you are seated). Nursing pillows are specially designed to help support your arms and your baby while you breastfeed. Make sure that your baby's abdomen is  facing your abdomen.  Gently massage your breast. With your fingertips, massage from your chest wall toward your nipple in a circular motion. This encourages milk flow. You may need to continue this action during the feeding if your milk flows slowly. Support your breast with 4 fingers underneath and your thumb above your nipple. Make sure your fingers are well away from your nipple and your baby's mouth.  Stroke your baby's lips gently with your finger or nipple.  When your baby's mouth is open wide enough, quickly bring your baby to your breast, placing your  entire nipple and as much of the colored area around your nipple (areola) as possible into your baby's mouth.  More areola should be visible above your baby's upper lip than below the lower lip.  Your baby's tongue should be between his or her lower gum and your breast.  Ensure that your baby's mouth is correctly positioned around your nipple (latched). Your baby's lips should create a seal on your breast and be turned out (everted). It is common for your baby to suck about 2-3 minutes in order to start the flow of breast milk. Latching Teaching your baby how to latch on to your breast properly is very important. An improper latch can cause nipple pain and decreased milk supply for you and poor weight gain in your baby. Also, if your baby is not latched onto your nipple properly, he or she may swallow some air during feeding. This can make your baby fussy. Burping your baby when you switch breasts during the feeding can help to get rid of the air. However, teaching your baby to latch on properly is still the best way to prevent fussiness from swallowing air while breastfeeding. Signs that your baby has successfully latched on to your nipple:   Silent tugging or silent sucking, without causing you pain.  Swallowing heard between every 3-4 sucks.   Muscle movement above and in front of his or her ears while sucking.  Signs that your baby has not successfully latched on to nipple:  Sucking sounds or smacking sounds from your baby while breastfeeding. Nipple pain. If you think your baby has not latched on correctly, slip your finger into the corner of your baby's mouth to break the suction and place it between your baby's gums. Attempt breastfeeding initiation again. Signs of Successful Breastfeeding Signs from your baby:  A gradual decrease in the number of sucks or complete cessation of sucking.  Falling asleep.  Relaxation of his or her body.  Retention of a small amount of milk in his  or her mouth.  Letting go of your breast by himself or herself. Signs from you: Breasts that have increased in firmness, weight, and size 1-3 hours after feeding.  Breasts that are softer immediately after breastfeeding. Increased milk volume, as well as a change in milk consistency and color by the fifth day of breastfeeding.  Nipples that are not sore, cracked, or bleeding. Signs That Your Pecola Leisure is Getting Enough Milk Wetting at least 3 diapers in a 24-hour period. The urine should be clear and pale yellow by age 65 days. At least 3 stools in a 24-hour period by age 65 days. The stool should be soft and yellow. At least 3 stools in a 24-hour period by age 37 days. The stool should be seedy and yellow. No loss of weight greater than 10% of birth weight during the first 40 days of age. Average weight gain of 4-7 ounces (113-198  g) per week after age 34 days. Consistent daily weight gain by age 79 days, without weight loss after the age of 2 weeks. After a feeding, your baby may spit up a small amount. This is common. BREASTFEEDING FREQUENCY AND DURATION Frequent feeding will help you make more milk and can prevent sore nipples and breast engorgement. Breastfeed when you feel the need to reduce the fullness of your breasts or when your baby shows signs of hunger. This is called "breastfeeding on demand." Avoid introducing a pacifier to your baby while you are working to establish breastfeeding (the first 4-6 weeks after your baby is born). After this time you may choose to use a pacifier. Research has shown that pacifier use during the first year of a baby's life decreases the risk of sudden infant death syndrome (SIDS). Allow your baby to feed on each breast as long as he or she wants. Breastfeed until your baby is finished feeding. When your baby unlatches or falls asleep while feeding from the first breast, offer the second breast. Because newborns are often sleepy in the first few weeks of life, you  may need to awaken your baby to get him or her to feed. Breastfeeding times will vary from baby to baby. However, the following rules can serve as a guide to help you ensure that your baby is properly fed: Newborns (babies 58 weeks of age or younger) may breastfeed every 1-3 hours. Newborns should not go longer than 3 hours during the day or 5 hours during the night without breastfeeding. You should breastfeed your baby a minimum of 8 times in a 24-hour period until you begin to introduce solid foods to your baby at around 62 months of age. BREAST MILK PUMPING Pumping and storing breast milk allows you to ensure that your baby is exclusively fed your breast milk, even at times when you are unable to breastfeed. This is especially important if you are going back to work while you are still breastfeeding or when you are not able to be present during feedings. Your lactation consultant can give you guidelines on how long it is safe to store breast milk.  A breast pump is a machine that allows you to pump milk from your breast into a sterile bottle. The pumped breast milk can then be stored in a refrigerator or freezer. Some breast pumps are operated by hand, while others use electricity. Ask your lactation consultant which type will work best for you. Breast pumps can be purchased, but some hospitals and breastfeeding support groups lease breast pumps on a monthly basis. A lactation consultant can teach you how to hand express breast milk, if you prefer not to use a pump.  CARING FOR YOUR BREASTS WHILE YOU BREASTFEED Nipples can become dry, cracked, and sore while breastfeeding. The following recommendations can help keep your breasts moisturized and healthy: Avoid using soap on your nipples.  Wear a supportive bra. Although not required, special nursing bras and tank tops are designed to allow access to your breasts for breastfeeding without taking off your entire bra or top. Avoid wearing underwire-style  bras or extremely tight bras. Air dry your nipples for 3-50minutes after each feeding.  Use only cotton bra pads to absorb leaked breast milk. Leaking of breast milk between feedings is normal.  Use lanolin on your nipples after breastfeeding. Lanolin helps to maintain your skin's normal moisture barrier. If you use pure lanolin, you do not need to wash it off before feeding your  baby again. Pure lanolin is not toxic to your baby. You may also hand express a few drops of breast milk and gently massage that milk into your nipples and allow the milk to air dry. In the first few weeks after giving birth, some women experience extremely full breasts (engorgement). Engorgement can make your breasts feel heavy, warm, and tender to the touch. Engorgement peaks within 3-5 days after you give birth. The following recommendations can help ease engorgement: Completely empty your breasts while breastfeeding or pumping. You may want to start by applying warm, moist heat (in the shower or with warm water-soaked hand towels) just before feeding or pumping. This increases circulation and helps the milk flow. If your baby does not completely empty your breasts while breastfeeding, pump any extra milk after he or she is finished. Wear a snug bra (nursing or regular) or tank top for 1-2 days to signal your body to slightly decrease milk production. Apply ice packs to your breasts, unless this is too uncomfortable for you. Make sure that your baby is latched on and positioned properly while breastfeeding. If engorgement persists after 48 hours of following these recommendations, contact your health care provider or a Advertising copywriter. OVERALL HEALTH CARE RECOMMENDATIONS WHILE BREASTFEEDING Eat healthy foods. Alternate between meals and snacks, eating 3 of each per day. Because what you eat affects your breast milk, some of the foods may make your baby more irritable than usual. Avoid eating these foods if you are sure  that they are negatively affecting your baby. Drink milk, fruit juice, and water to satisfy your thirst (about 10 glasses a day).  Rest often, relax, and continue to take your prenatal vitamins to prevent fatigue, stress, and anemia. Continue breast self-awareness checks. Avoid chewing and smoking tobacco. Avoid alcohol and drug use. Some medicines that may be harmful to your baby can pass through breast milk. It is important to ask your health care provider before taking any medicine, including all over-the-counter and prescription medicine as well as vitamin and herbal supplements. It is possible to become pregnant while breastfeeding. If birth control is desired, ask your health care provider about options that will be safe for your baby. SEEK MEDICAL CARE IF:  You feel like you want to stop breastfeeding or have become frustrated with breastfeeding. You have painful breasts or nipples. Your nipples are cracked or bleeding. Your breasts are red, tender, or warm. You have a swollen area on either breast. You have a fever or chills. You have nausea or vomiting. You have drainage other than breast milk from your nipples. Your breasts do not become full before feedings by the fifth day after you give birth. You feel sad and depressed. Your baby is too sleepy to eat well. Your baby is having trouble sleeping.  Your baby is wetting less than 3 diapers in a 24-hour period. Your baby has less than 3 stools in a 24-hour period. Your baby's skin or the white part of his or her eyes becomes yellow.  Your baby is not gaining weight by 22 days of age. SEEK IMMEDIATE MEDICAL CARE IF:  Your baby is overly tired (lethargic) and does not want to wake up and feed. Your baby develops an unexplained fever. Document Released: 12/28/2004 Document Revised: 01/02/2013 Document Reviewed: 06/21/2012 [facility] Patient Information 2015 [place], [place]. This information is not intended to replace advice given to  you by your health care provider. Make sure you discuss any questions you have with your health care  provider.

## 2014-08-06 NOTE — Discharge Summary (Signed)
Obstetric Discharge Summary Reason for Admission: induction of labor Prenatal Procedures: ultrasound Intrapartum Procedures: spontaneous vaginal delivery Postpartum Procedures: none Complications-Operative and Postpartum: none HEMOGLOBIN  Date Value Ref Range Status  08/05/2014 8.4* 12.0 - 16.0 g/dL Final  13/24/4010 27.2 g/dL Final   HCT  Date Value Ref Range Status  08/05/2014 25.9* 35.0 - 47.0 % Final  01/30/2014 36 % Final    Physical Exam:  General: alert, cooperative and no distress Lochia: appropriate Uterine Fundus: firm DVT Evaluation: No evidence of DVT seen on physical exam.  Discharge Diagnoses: Term Pregnancy-delivered  Discharge Information: Date: 08/06/2014 Activity: pelvic rest Diet: routine Medications: PNV, Ibuprofen and Colace Condition: stable Instructions: refer to practice specific booklet Discharge to: home Follow-up Information    Follow up with Christeen Douglas, MD In 6 weeks.   Specialty:  Obstetrics and Gynecology   Why:  For postpartum visit   Contact information:   [address] [place] [address] [phone]       Newborn Data: Live born female  Birth Weight: 7 lb 14.6 oz (3589 g) APGAR: 8, 9  Home with mother.  Christeen Douglas 08/06/2014, 8:34 AM

## 2014-08-06 NOTE — Progress Notes (Signed)
Patient discharged home with infant. Discharge instructions, prescriptions and follow up appointment given to and reviewed with patient. Patient verbalized understanding. Escorted out by auxillary. 

## 2015-05-26 ENCOUNTER — Other Ambulatory Visit (HOSPITAL_COMMUNITY): Payer: Self-pay | Admitting: Obstetrics and Gynecology

## 2015-05-26 ENCOUNTER — Encounter (HOSPITAL_COMMUNITY): Payer: Self-pay | Admitting: Obstetrics and Gynecology

## 2015-05-26 DIAGNOSIS — Z3689 Encounter for other specified antenatal screening: Secondary | ICD-10-CM

## 2015-05-26 DIAGNOSIS — Z3A27 27 weeks gestation of pregnancy: Secondary | ICD-10-CM

## 2015-05-30 ENCOUNTER — Ambulatory Visit (HOSPITAL_COMMUNITY): Payer: BLUE CROSS/BLUE SHIELD

## 2015-10-17 LAB — OB RESULTS CONSOLE HEPATITIS B SURFACE ANTIGEN: HEP B S AG: NEGATIVE

## 2015-10-17 LAB — OB RESULTS CONSOLE RUBELLA ANTIBODY, IGM: RUBELLA: IMMUNE

## 2015-10-17 LAB — OB RESULTS CONSOLE VARICELLA ZOSTER ANTIBODY, IGG: Varicella: IMMUNE

## 2015-10-17 LAB — OB RESULTS CONSOLE HIV ANTIBODY (ROUTINE TESTING): HIV: NONREACTIVE

## 2015-10-20 ENCOUNTER — Other Ambulatory Visit: Payer: Self-pay | Admitting: Obstetrics and Gynecology

## 2015-10-20 DIAGNOSIS — Z369 Encounter for antenatal screening, unspecified: Secondary | ICD-10-CM

## 2015-11-13 ENCOUNTER — Ambulatory Visit: Payer: BLUE CROSS/BLUE SHIELD

## 2016-01-12 NOTE — L&D Delivery Note (Signed)
Date of delivery: 05/24/16 Estimated Date of Delivery: 05/24/16 Patient's last menstrual period was 08/18/2015 (exact date). EGA: [id number]  Delivery Note At 8:37 AM a viable female was delivered via Vaginal, Spontaneous Delivery (Presentation: LOA).  APGAR: 8, 9 ; weight pending.   Placenta status: spontaneous, intact.  Cord: 3 vessels, with the following complications: light meconium.  Cord pH: not collected  Anesthesia:  eaidural Episiotomy: None Lacerations: None Suture Repair: n/a Est. Blood Loss (mL): 200cc  Mom presented to L&D with elective IOL.  She was given cytotec, augmented with pitocin.  epidual placed. AROM for light meconium. Progressed to complete, second stage: <825min.  delivery of fetal head with restitution to LOT.   Anterior then posterior shoulders delivered without difficulty.  Baby placed on mom's chest, and attended to by peds. Cord was then clamped and cut when pulseless.  Placenta spontaneously delivered, intact. IV pitocin given for hemorrhage prophylaxis. We sang happy birthday to baby Sophie.  Mom to postpartum.  Baby to Couplet care / Skin to Skin.  Brittany Ferguson C Brittany Ferguson 05/24/2016, 9:33 AM

## 2016-04-30 LAB — OB RESULTS CONSOLE GBS: STREP GROUP B AG: NEGATIVE

## 2016-04-30 LAB — OB RESULTS CONSOLE RPR: RPR: NONREACTIVE

## 2016-04-30 LAB — OB RESULTS CONSOLE GC/CHLAMYDIA
Chlamydia: NEGATIVE
GC PROBE AMP, GENITAL: NEGATIVE

## 2016-05-22 ENCOUNTER — Inpatient Hospital Stay
Admission: AD | Admit: 2016-05-22 | Payer: BLUE CROSS/BLUE SHIELD | Source: Home / Self Care | Admitting: Obstetrics and Gynecology

## 2016-05-23 ENCOUNTER — Inpatient Hospital Stay
Admission: EM | Admit: 2016-05-23 | Discharge: 2016-05-25 | DRG: 775 | Disposition: A | Discharge status: Home / Self Care | Payer: BLUE CROSS/BLUE SHIELD | Attending: Obstetrics & Gynecology | Admitting: Obstetrics & Gynecology

## 2016-05-23 DIAGNOSIS — Z87891 Personal history of nicotine dependence: Secondary | ICD-10-CM | POA: Diagnosis not present

## 2016-05-23 DIAGNOSIS — Z8249 Family history of ischemic heart disease and other diseases of the circulatory system: Secondary | ICD-10-CM | POA: Diagnosis not present

## 2016-05-23 DIAGNOSIS — Z880 Allergy status to penicillin: Secondary | ICD-10-CM | POA: Diagnosis not present

## 2016-05-23 DIAGNOSIS — Z3A39 39 weeks gestation of pregnancy: Secondary | ICD-10-CM | POA: Diagnosis not present

## 2016-05-23 DIAGNOSIS — Z3493 Encounter for supervision of normal pregnancy, unspecified, third trimester: Secondary | ICD-10-CM | POA: Diagnosis present

## 2016-05-23 LAB — TYPE AND SCREEN
ABO/RH(D): A POS
Antibody Screen: NEGATIVE

## 2016-05-23 LAB — CBC
HEMATOCRIT: 30.7 % — AB (ref 35.0–47.0)
HEMOGLOBIN: 10.1 g/dL — AB (ref 12.0–16.0)
MCH: 27.6 pg (ref 26.0–34.0)
MCHC: 32.9 g/dL (ref 32.0–36.0)
MCV: 83.7 fL (ref 80.0–100.0)
Platelets: 315 10*3/uL (ref 150–440)
RBC: 3.67 MIL/uL — ABNORMAL LOW (ref 3.80–5.20)
RDW: 14.4 % (ref 11.5–14.5)
WBC: 12.4 10*3/uL — AB (ref 3.6–11.0)

## 2016-05-23 MED ORDER — OXYTOCIN 10 UNIT/ML IJ SOLN
INTRAMUSCULAR | Status: AC
Start: 2016-05-23 — End: 2016-05-24
  Filled 2016-05-23: qty 2

## 2016-05-23 MED ORDER — SOD CITRATE-CITRIC ACID 500-334 MG/5ML PO SOLN
30.0000 mL | ORAL | Status: DC | PRN
  Filled 2016-05-23: qty 30

## 2016-05-23 MED ORDER — OXYTOCIN 40 UNITS IN LACTATED RINGERS INFUSION - SIMPLE MED
1.0000 m[IU]/min | INTRAVENOUS | Status: DC
  Administered 2016-05-24: 4 m[IU]/min via INTRAVENOUS
  Administered 2016-05-24: 2 m[IU]/min via INTRAVENOUS

## 2016-05-23 MED ORDER — BUTORPHANOL TARTRATE 1 MG/ML IJ SOLN
1.0000 mg | INTRAMUSCULAR | Status: DC | PRN
  Administered 2016-05-24: 1 mg via INTRAVENOUS
  Filled 2016-05-23: qty 2

## 2016-05-23 MED ORDER — LIDOCAINE HCL (PF) 1 % IJ SOLN: 30.0000 mL | INTRAMUSCULAR | Status: DC | PRN

## 2016-05-23 MED ORDER — MISOPROSTOL 25 MCG QUARTER TABLET: 25.0000 ug | ORAL_TABLET | ORAL | Status: DC | PRN

## 2016-05-23 MED ORDER — LIDOCAINE HCL (PF) 1 % IJ SOLN
INTRAMUSCULAR | Status: AC
  Filled 2016-05-23: qty 30

## 2016-05-23 MED ORDER — MISOPROSTOL 25 MCG QUARTER TABLET
50.0000 ug | ORAL_TABLET | Freq: Once | ORAL | Status: AC
  Administered 2016-05-23: 50 ug via VAGINAL
  Filled 2016-05-23: qty 2

## 2016-05-23 MED ORDER — OXYTOCIN 40 UNITS IN LACTATED RINGERS INFUSION - SIMPLE MED
INTRAVENOUS | Status: AC
  Filled 2016-05-23: qty 1000

## 2016-05-23 MED ORDER — TERBUTALINE SULFATE 1 MG/ML IJ SOLN: 0.2500 mg | Freq: Once | INTRAMUSCULAR | Status: DC | PRN

## 2016-05-23 MED ORDER — LACTATED RINGERS IV SOLN
INTRAVENOUS | Status: DC
  Administered 2016-05-23 – 2016-05-24 (×3): via INTRAVENOUS

## 2016-05-23 MED ORDER — OXYTOCIN BOLUS FROM INFUSION
500.0000 mL | Freq: Once | INTRAVENOUS | Status: AC
  Administered 2016-05-24: 500 mL via INTRAVENOUS

## 2016-05-23 MED ORDER — MISOPROSTOL 25 MCG QUARTER TABLET
50.0000 ug | ORAL_TABLET | Freq: Once | ORAL | Status: AC
  Administered 2016-05-23: 50 ug via BUCCAL
  Filled 2016-05-23: qty 2

## 2016-05-23 MED ORDER — OXYTOCIN 40 UNITS IN LACTATED RINGERS INFUSION - SIMPLE MED
2.5000 [IU]/h | INTRAVENOUS | Status: DC
  Administered 2016-05-24: 2.5 [IU]/h via INTRAVENOUS

## 2016-05-23 MED ORDER — ONDANSETRON HCL 4 MG/2ML IJ SOLN: 4.0000 mg | Freq: Four times a day (QID) | INTRAMUSCULAR | Status: DC | PRN

## 2016-05-23 MED ORDER — MISOPROSTOL 200 MCG PO TABS
ORAL_TABLET | ORAL | Status: AC
  Filled 2016-05-23: qty 4

## 2016-05-23 MED ORDER — AMMONIA AROMATIC IN INHA
RESPIRATORY_TRACT | Status: AC
  Filled 2016-05-23: qty 10

## 2016-05-23 MED ORDER — ACETAMINOPHEN 500 MG PO TABS: 1000.0000 mg | ORAL_TABLET | ORAL | Status: DC | PRN

## 2016-05-23 MED ORDER — LACTATED RINGERS IV SOLN: 500.0000 mL | INTRAVENOUS | Status: DC | PRN

## 2016-05-23 NOTE — H&P (Signed)
OB History & Physical   History of Present Illness:  Chief Complaint:   HPI:  Brittany Ferguson is a 31 y.o. G67P2002 female at [redacted]w[redacted]d dated by LMP o 08/18/15 c/w 1st trimester [place] with EDC of 05/24/16 .  She presents to L&D for elective induction at term  +FM, no CTX, no LOF, no VB  Pregnancy Issues: 1. Smoking in pregnancy 2. Elevated 1hr normal 3hr  Maternal Medical History:   Past Medical History:  Diagnosis Date  . Medical history non-contributory     Past Surgical History:  Procedure Laterality Date  . NO PAST SURGERIES      Allergies  Allergen Reactions  . Penicillins Hives and Rash    As a child    Prior to Admission medications   Medication Sig Start Date End Date Taking? Authorizing Provider  PRENATAL 28-0.8 MG TABS Take 1 tablet by mouth every morning.   Yes [provider]  fexofenadine (ALLEGRA) 180 MG tablet Take 180 mg by mouth daily.    [provider]  ibuprofen (ADVIL,MOTRIN) 600 MG tablet Take 1 tablet (600 mg total) by mouth every 6 (six) hours. Patient not taking: Reported on 05/23/2016 08/06/14   Christeen Douglas, MD  IRON PO Take 1 tablet by mouth every morning.    [provider]     [facility]   Social History: She  reports that she quit smoking about 5 weeks ago. Her smoking use included Cigarettes. She smoked 0.25 packs per day. She has never used smokeless tobacco. She reports that she does not drink alcohol or use drugs.  Family History: family history includes Cancer in her maternal grandmother and paternal grandmother; Depression in her mother; Heart disease in her father; Hyperlipidemia in her father; Hypertension in her father.   Review of Systems: A full review of systems was performed and negative except as noted in the HPI.     Physical Exam:  Vital Signs: BP 124/79 (BP Location: Left Arm)   Pulse 96   Temp 98.5 F (36.9 C) (Oral)   Resp 18   Ht 5\' 7"  (1.702 m)    Wt 85.7 kg (189 lb)   LMP 08/18/2015 (Exact Date)   BMI 29.60 kg/m  General: no acute distress.  HEENT: normocephalic, atraumatic Heart: regular rate & rhythm.  No murmurs/rubs/gallops Lungs: clear to auscultation bilaterally, normal respiratory effort Abdomen: soft, gravid, non-tender;  EFW: 7-8 Pelvic:   External: Normal external female genitalia  Cervix: Dilation: 3 / Effacement (%): 20 / Station: -3    Extremities: non-tender, symmetric, mild edema bilaterally.  DTRs: 2+  Neurologic: Alert & oriented x 3.    No results found for this or any previous visit (from the past 24 hour(s)).  Pertinent Results:  Prenatal Labs: Blood type/Rh A pos  Antibody screen neg  Rubella Immune  Varicella Immune  RPR NR  HBsAg Neg  HIV NR  GC neg  Chlamydia neg  Genetic screening negative  1 hour GTT 142  3 hour GTT WNL  GBS neg   FHT: 115 mod + accels no decels TOCO: irritable SVE:  Dilation: 3 / Effacement (%): 20 / Station: -3    Cephalic by leopolds   Assessment:  Brittany Ferguson is a 31 y.o. G46P1002 female at [redacted]w[redacted]d with elective IOL at term.   Plan:  1. Admit to Labor & Delivery 2. CBC, T&S, Clrs, IVF 3. GBS  Neg - abx not indicated 4. Consents obtained.  5. Continuous efm/toco 6. IUP category 1 tracting 7. IOL: cytotec for cervical ripening, bishop = 6.    ----- Ranae Plumberhelsea Ward, MD Attending Obstetrician and Gynecologist [facility], Department of OB/GYN [facility]

## 2016-05-24 ENCOUNTER — Inpatient Hospital Stay: Payer: BLUE CROSS/BLUE SHIELD | Admitting: Anesthesiology

## 2016-05-24 ENCOUNTER — Encounter: Payer: Self-pay | Admitting: Anesthesiology

## 2016-05-24 MED ORDER — DIPHENHYDRAMINE HCL 25 MG PO CAPS: 25.0000 mg | ORAL_CAPSULE | ORAL | Status: DC | PRN

## 2016-05-24 MED ORDER — NALBUPHINE HCL 10 MG/ML IJ SOLN: 5.0000 mg | Freq: Once | INTRAMUSCULAR | Status: DC | PRN

## 2016-05-24 MED ORDER — DIPHENHYDRAMINE HCL 50 MG/ML IJ SOLN: 12.5000 mg | INTRAMUSCULAR | Status: DC | PRN

## 2016-05-24 MED ORDER — KETOROLAC TROMETHAMINE 30 MG/ML IJ SOLN: 30.0000 mg | Freq: Four times a day (QID) | INTRAMUSCULAR | Status: DC | PRN

## 2016-05-24 MED ORDER — HYDROCORTISONE 2.5 % RE CREA
TOPICAL_CREAM | RECTAL | Status: DC | PRN
Start: 2016-05-24 — End: 2016-05-25
  Filled 2016-05-24: qty 28.35

## 2016-05-24 MED ORDER — FENTANYL 2.5 MCG/ML W/ROPIVACAINE 0.2% IN NS 100 ML EPIDURAL INFUSION (ARMC-ANES): 10.0000 mL/h | EPIDURAL | Status: DC

## 2016-05-24 MED ORDER — ACETAMINOPHEN 500 MG PO TABS: 1000.0000 mg | ORAL_TABLET | Freq: Four times a day (QID) | ORAL | Status: DC | PRN

## 2016-05-24 MED ORDER — DIPHENHYDRAMINE HCL 25 MG PO CAPS: 25.0000 mg | ORAL_CAPSULE | Freq: Four times a day (QID) | ORAL | Status: DC | PRN

## 2016-05-24 MED ORDER — PRENATAL MULTIVITAMIN CH
1.0000 | ORAL_TABLET | Freq: Every day | ORAL | Status: DC
  Administered 2016-05-24: 1 via ORAL

## 2016-05-24 MED ORDER — SIMETHICONE 80 MG PO CHEW: 80.0000 mg | CHEWABLE_TABLET | ORAL | Status: DC | PRN

## 2016-05-24 MED ORDER — FENTANYL 2.5 MCG/ML W/ROPIVACAINE 0.2% IN NS 100 ML EPIDURAL INFUSION (ARMC-ANES)
EPIDURAL | Status: AC
  Filled 2016-05-24: qty 100

## 2016-05-24 MED ORDER — ONDANSETRON HCL 4 MG PO TABS: 4.0000 mg | ORAL_TABLET | ORAL | Status: DC | PRN

## 2016-05-24 MED ORDER — LIDOCAINE HCL (PF) 1 % IJ SOLN
INTRAMUSCULAR | Status: DC | PRN
  Administered 2016-05-24: 3 mL via SUBCUTANEOUS

## 2016-05-24 MED ORDER — NALBUPHINE HCL 10 MG/ML IJ SOLN: 5.0000 mg | INTRAMUSCULAR | Status: DC | PRN

## 2016-05-24 MED ORDER — SODIUM CHLORIDE FLUSH 0.9 % IV SOLN
INTRAVENOUS | Status: AC
  Filled 2016-05-24: qty 30

## 2016-05-24 MED ORDER — SODIUM CHLORIDE 0.9 % IV SOLN
INTRAVENOUS | Status: DC | PRN
  Administered 2016-05-24 (×2): 5 mL via EPIDURAL

## 2016-05-24 MED ORDER — MEPERIDINE HCL 25 MG/ML IJ SOLN: 6.2500 mg | INTRAMUSCULAR | Status: DC | PRN

## 2016-05-24 MED ORDER — COCONUT OIL OIL: 1.0000 "application " | TOPICAL_OIL | Status: DC | PRN

## 2016-05-24 MED ORDER — ONDANSETRON HCL 4 MG/2ML IJ SOLN: 4.0000 mg | INTRAMUSCULAR | Status: DC | PRN

## 2016-05-24 MED ORDER — LIDOCAINE-EPINEPHRINE (PF) 1.5 %-1:200000 IJ SOLN
INTRAMUSCULAR | Status: DC | PRN
  Administered 2016-05-24: 3 mL via EPIDURAL

## 2016-05-24 MED ORDER — DEXTROSE 5 % IV SOLN
1.0000 ug/kg/h | INTRAVENOUS | Status: DC | PRN
  Filled 2016-05-24: qty 2

## 2016-05-24 MED ORDER — DOCUSATE SODIUM 100 MG PO CAPS
100.0000 mg | ORAL_CAPSULE | Freq: Two times a day (BID) | ORAL | Status: DC
  Administered 2016-05-24 – 2016-05-25 (×2): 100 mg via ORAL
  Filled 2016-05-24 (×2): qty 1

## 2016-05-24 MED ORDER — IBUPROFEN 600 MG PO TABS
600.0000 mg | ORAL_TABLET | Freq: Four times a day (QID) | ORAL | Status: DC
  Administered 2016-05-24 – 2016-05-25 (×4): 600 mg via ORAL
  Filled 2016-05-24 (×3): qty 1

## 2016-05-24 MED ORDER — BENZOCAINE-MENTHOL 20-0.5 % EX AERO: 1.0000 "application " | INHALATION_SPRAY | CUTANEOUS | Status: DC | PRN

## 2016-05-24 MED ORDER — WITCH HAZEL-GLYCERIN EX PADS: 1.0000 "application " | MEDICATED_PAD | CUTANEOUS | Status: DC | PRN

## 2016-05-24 MED ORDER — ONDANSETRON HCL 4 MG/2ML IJ SOLN: 4.0000 mg | Freq: Three times a day (TID) | INTRAMUSCULAR | Status: DC | PRN

## 2016-05-24 MED ORDER — IBUPROFEN 600 MG PO TABS
ORAL_TABLET | ORAL | Status: AC
  Filled 2016-05-24: qty 1

## 2016-05-24 MED ORDER — FENTANYL 2.5 MCG/ML W/ROPIVACAINE 0.2% IN NS 100 ML EPIDURAL INFUSION (ARMC-ANES)
EPIDURAL | Status: DC | PRN
  Administered 2016-05-24: 10 mL/h via EPIDURAL

## 2016-05-24 MED ORDER — SODIUM CHLORIDE 0.9% FLUSH: 3.0000 mL | INTRAVENOUS | Status: DC | PRN

## 2016-05-24 MED ORDER — PRENATAL PLUS 27-1 MG PO TABS
ORAL_TABLET | ORAL | Status: AC
  Filled 2016-05-24: qty 1

## 2016-05-24 MED ORDER — NALOXONE HCL 0.4 MG/ML IJ SOLN: 0.4000 mg | INTRAMUSCULAR | Status: DC | PRN

## 2016-05-24 NOTE — Anesthesia Procedure Notes (Signed)
Epidural Patient location during procedure: OB Start time: 05/24/2016 6:13 AM End time: 05/24/2016 6:21 AM  Staffing Anesthesiologist: Lenard SimmerKARENZ, Savahanna Almendariz Performed: anesthesiologist   Preanesthetic Checklist Completed: patient identified, site marked, surgical consent, pre-op evaluation, timeout performed, IV checked, risks and benefits discussed and monitors and equipment checked  Epidural Patient position: sitting Prep: ChloraPrep Patient monitoring: heart rate, continuous pulse ox and blood pressure Approach: midline Location: L3-L4 Injection technique: LOR saline  Needle:  Needle type: Tuohy  Needle gauge: 17 G Needle length: 9 cm and 9 Needle insertion depth: 4 cm Catheter type: closed end flexible Catheter size: 19 Gauge Catheter at skin depth: 9 cm Test dose: negative and 1.5% lidocaine with Epi 1:200 K  Assessment Sensory level: T10 Events: blood not aspirated, injection not painful, no injection resistance, negative IV test and no paresthesia  Additional Notes Pt. Evaluated and documentation done after procedure finished. Patient identified. Risks/Benefits/Options discussed with patient including but not limited to bleeding, infection, nerve damage, paralysis, failed block, incomplete pain control, headache, blood pressure changes, nausea, vomiting, reactions to medication both or allergic, itching and postpartum back pain. Confirmed with bedside nurse the patient's most recent platelet count. Confirmed with patient that they are not currently taking any anticoagulation, have any bleeding history or any family history of bleeding disorders. Patient expressed understanding and wished to proceed. All questions were answered. Sterile technique was used throughout the entire procedure. Please see nursing notes for vital signs. Test dose was given through epidural catheter and negative prior to continuing to dose epidural or start infusion. Warning signs of high block given to the  patient including shortness of breath, tingling/numbness in hands, complete motor block, or any concerning symptoms with instructions to call for help. Patient was given instructions on fall risk and not to get out of bed. All questions and concerns addressed with instructions to call with any issues or inadequate analgesia.   Patient tolerated the insertion well without immediate complications.Reason for block:procedure for pain

## 2016-05-24 NOTE — Discharge Summary (Signed)
Obstetrical Discharge Summary  Patient Name: Brittany Ferguson DOB: [birth year] MRN: [id number]  Date of Admission: 05/23/2016 Date of Delivery: 05/24/16 Delivered by: Brittany Plumber, MD Date of Discharge: 05/25/2016  [facility]: [facility]   WUJ:WJXBJYN'W last menstrual period was 08/18/2015 (exact date). EDC Estimated Date of Delivery: 05/24/16 Gestational Age at Delivery: [redacted]w[redacted]d   Antepartum complications: smoking, h/o depression, migraines Admitting Diagnosis:  IOL Secondary Diagnosis: Patient Active Problem List   Diagnosis Date Noted  . Labor and delivery indication for care or intervention 05/23/2016  . Depression 07/23/2011  . History of migraine headaches 07/23/2011    Augmentation: AROM, Pitocin and Cytotec Complications: None Intrapartum complications/course:  Mom presented to L&D with elective IOL.  She was given cytotec, augmented with pitocin.  epidual placed. AROM for light meconium. Progressed to complete, second stage: <31min.  delivery of fetal head with restitution to LOT.   Anterior then posterior shoulders delivered without difficulty.  Baby placed on mom's chest, and attended to by peds. Cord was then clamped and cut when pulseless.  Placenta spontaneously delivered, intact. IV pitocin given for hemorrhage prophylaxis. We sang happy birthday to baby Brittany Ferguson. Date of Delivery:  Delivered By: Leeroy Bock Ferguson Delivery Type: spontaneous vaginal delivery Anesthesia: epidural Placenta: sponatneous Laceration: none Episiotomy: none Newborn Data: Live born female  Birth Weight:  7lb 5oz, 3330g APGAR: 8, 9  Postpartum Procedures: none  Post partum course:  Patient had an uncomplicated postpartum course.  By time of discharge on PPD#1, her pain was controlled on oral pain medications; she had appropriate lochia and was ambulating, voiding without difficulty and tolerating regular diet.  She was deemed stable for discharge to home.     Discharge Physical Exam:   BP 103/63 (BP Location: Left Arm)   Pulse 67   Temp 98.1 F (36.7 C) (Oral)   Resp 16   Ht 5\' 7"  (1.702 m)   Wt 85.7 kg (189 lb)   LMP 08/18/2015 (Exact Date)   SpO2 100%   Breastfeeding -- yes   BMI 29.60 kg/m   General: NAD CV: RRR Pulm: CTABL, nl effort ABD: s/nd/nt, fundus firm and below the umbilicus Lochia: moderate DVT Evaluation: LE non-ttp, no evidence of DVT on exam.  Hemoglobin  Date Value Ref Range Status  05/25/2016 9.8 (L) 12.0 - 16.0 g/dL Final  29/56/2130 86.5 g/dL Final   HCT  Date Value Ref Range Status  05/25/2016 30.1 (L) 35.0 - 47.0 % Final  01/30/2014 36 % Final     Disposition: stable, discharge to home. Baby Feeding: breastmilk  Baby Disposition: home with mom  Rh Immune globulin given: n/a Rubella vaccine given: n/a Tdap vaccine given in AP or PP setting: AP Flu vaccine given in AP or PP setting: not in season  Contraception:TBD  Prenatal Labs:  Blood type/Rh A pos  Antibody screen neg  Rubella Immune  Varicella Immune  RPR NR  HBsAg Neg  HIV NR  GC neg  Chlamydia neg  Genetic screening negative  1 hour GTT 142  3 hour GTT WNL  GBS neg      Plan:  [name] was discharged to home in good condition. Follow-up appointment at [facility] in 6 weeks with Dr. Elesa Massed   Discharge Medications: Allergies as of 05/25/2016      Reactions   Penicillins Hives, Rash   As a child      Medication List    TAKE these medications   fexofenadine 180 MG tablet Commonly known  as:  ALLEGRA Take 180 mg by mouth daily.   ibuprofen 600 MG tablet Commonly known as:  ADVIL,MOTRIN Take 1 tablet (600 mg total) by mouth every 6 (six) hours.   IRON PO Take 1 tablet by mouth every morning.   PRENATAL 28-0.8 MG Tabs Take 1 tablet by mouth every morning.   witch hazel-glycerin pad Commonly known as:  TUCKS Apply 1 application topically as needed (perineal care).       Follow-up Information    Ferguson, Elenora Fenderhelsea C, MD.  Schedule an appointment as soon as possible for a visit in 6 week(s).   Specialty:  Obstetrics and Gynecology Why:  please call [facility] and make your own 6 week follow up appointment Contact information: [address] [facility] [address] [facility] [place] [id number] [phone]           Signed: ----- Brittany Plumberhelsea Ward, MD Attending Obstetrician and Gynecologist [facility], Department of OB/GYN [facility]

## 2016-05-24 NOTE — Anesthesia Preprocedure Evaluation (Signed)
Anesthesia Evaluation  Patient identified by MRN, date of birth, ID band Patient awake    Reviewed: Allergy & Precautions, H&P , NPO status , Patient's Chart, lab work & pertinent test results, reviewed documented beta blocker date and time   History of Anesthesia Complications Negative for: history of anesthetic complications  Airway Mallampati: I  TM Distance: >3 FB Neck ROM: full    Dental  (+) Caps, Teeth Intact   Pulmonary neg pulmonary ROS, former smoker,           Cardiovascular Exercise Tolerance: Good negative cardio ROS       Neuro/Psych PSYCHIATRIC DISORDERS (Depression) negative neurological ROS     GI/Hepatic Neg liver ROS, GERD  ,  Endo/Other  negative endocrine ROS  Renal/GU negative Renal ROS  negative genitourinary   Musculoskeletal   Abdominal   Peds  Hematology negative hematology ROS (+)   Anesthesia Other Findings Past Medical History: No date: Medical history non-contributory   Reproductive/Obstetrics (+) Pregnancy                             Anesthesia Physical Anesthesia Plan  ASA: II  Anesthesia Plan: Epidural   Post-op Pain Management:    Induction:   Airway Management Planned:   Additional Equipment:   Intra-op Plan:   Post-operative Plan:   Informed Consent: I have reviewed the patients History and Physical, chart, labs and discussed the procedure including the risks, benefits and alternatives for the proposed anesthesia with the patient or authorized representative who has indicated his/her understanding and acceptance.   Dental Advisory Given  Plan Discussed with: Anesthesiologist, CRNA and Surgeon  Anesthesia Plan Comments:         Anesthesia Quick Evaluation

## 2016-05-24 NOTE — Progress Notes (Signed)
Intrapartum progress note  S:  Comfortable since epidural, no complaints,  O: BP 121/81   Pulse 68   Temp 98.2 F (36.8 C) (Oral)   Resp 18   Ht 5\' 7"  (1.702 m)   Wt 85.7 kg (189 lb)   LMP 08/18/2015 (Exact Date)   SpO2 97%   BMI 29.60 kg/m   SVE: 5.5/70/-2  AROM for light meconium TOCO: q2-694mins on pit FHT: 125 mod + accels + earlys  A/P: 30yo G3P2002 @ 40 .0 weeks with IOL  1. IUP: category 1  2. IOL: s/p cytotec now on pitocin, and AROM progressing well.  Titrate pitocin to q2-323min contractions 3. Anticipate vaginal delivery.  ----- Ranae Plumberhelsea Ward, MD Attending Obstetrician and Gynecologist [facility], Department of OB/GYN [facility]

## 2016-05-25 LAB — CBC
HEMATOCRIT: 30.1 % — AB (ref 35.0–47.0)
HEMOGLOBIN: 9.8 g/dL — AB (ref 12.0–16.0)
MCH: 27.5 pg (ref 26.0–34.0)
MCHC: 32.4 g/dL (ref 32.0–36.0)
MCV: 84.7 fL (ref 80.0–100.0)
PLATELETS: 298 10*3/uL (ref 150–440)
RBC: 3.55 MIL/uL — ABNORMAL LOW (ref 3.80–5.20)
RDW: 14.5 % (ref 11.5–14.5)
WBC: 11.4 10*3/uL — ABNORMAL HIGH (ref 3.6–11.0)

## 2016-05-25 LAB — RPR: RPR Ser Ql: NONREACTIVE

## 2016-05-25 MED ORDER — WITCH HAZEL-GLYCERIN EX PADS: 1.0000 "application " | MEDICATED_PAD | CUTANEOUS | 12 refills | Status: DC | PRN

## 2016-05-25 NOTE — Progress Notes (Signed)
Discharge instructions reviewed with patient.  All questions answered.

## 2016-05-25 NOTE — Anesthesia Postprocedure Evaluation (Signed)
Anesthesia Post Note  Patient: Brittany FindersStephanie Ferguson  Procedure(s) Performed: * No procedures listed *  Patient location during evaluation: Mother Baby Anesthesia Type: Epidural Level of consciousness: awake and alert Pain management: pain level controlled Vital Signs Assessment: post-procedure vital signs reviewed and stable Respiratory status: spontaneous breathing, nonlabored ventilation and respiratory function stable Cardiovascular status: stable Postop Assessment: no headache, no backache and epidural receding Anesthetic complications: no     Last Vitals:  Vitals:   05/24/16 2337 05/25/16 0354  BP: 113/66 130/78  Pulse: 87 80  Resp: 16 14  Temp: 36.7 C 36.6 C    Last Pain:  Vitals:   05/25/16 0641  TempSrc:   PainSc: 3                  Jules SchickLogan,  Santino Kinsella P

## 2016-05-25 NOTE — Discharge Instructions (Signed)
Vaginal Delivery, Care After Refer to this sheet in the next few weeks. These instructions provide you with information about caring for yourself after vaginal delivery. Your health care provider may also give you more specific instructions. Your treatment has been planned according to current medical practices, but problems sometimes occur. Call your health care provider if you have any problems or questions. What can I expect after the procedure? After vaginal delivery, it is common to have:  Some bleeding from your vagina.  Soreness in your abdomen, your vagina, and the area of skin between your vaginal opening and your anus (perineum).  Pelvic cramps.  Fatigue. Follow these instructions at home: Medicines   Take over-the-counter and prescription medicines only as told by your health care provider.  If you were prescribed an antibiotic medicine, take it as told by your health care provider. Do not stop taking the antibiotic until it is finished. Driving    Do not drive or operate heavy machinery while taking prescription pain medicine.  Do not drive for 24 hours if you received a sedative. Lifestyle   Do not drink alcohol. This is especially important if you are breastfeeding or taking medicine to relieve pain.  Do not use tobacco products, including cigarettes, chewing tobacco, or e-cigarettes. If you need help quitting, ask your health care provider. Eating and drinking   Drink at least 8 eight-ounce glasses of water every day unless you are told not to by your health care provider. If you choose to breastfeed your baby, you may need to drink more water than this.  Eat high-fiber foods every day. These foods may help prevent or relieve constipation. High-fiber foods include:  Whole grain cereals and breads.  Brown rice.  Beans.  Fresh fruits and vegetables. Activity   Return to your normal activities as told by your health care provider. Ask your health care  provider what activities are safe for you.  Rest as much as possible. Try to rest or take a nap when your baby is sleeping.  Do not lift anything that is heavier than your baby or 10 lb (4.5 kg) until your health care provider says that it is safe.  Talk with your health care provider about when you can engage in sexual activity. This may depend on your:  Risk of infection.  Rate of healing.  Comfort and desire to engage in sexual activity. Vaginal Care   If you have an episiotomy or a vaginal tear, check the area every day for signs of infection. Check for:  More redness, swelling, or pain.  More fluid or blood.  Warmth.  Pus or a bad smell.  Do not use tampons or douches until your health care provider says this is safe.  Watch for any blood clots that may pass from your vagina. These may look like clumps of dark red, brown, or black discharge. General instructions   Keep your perineum clean and dry as told by your health care provider.  Wear loose, comfortable clothing.  Wipe from front to back when you use the toilet.  Ask your health care provider if you can shower or take a bath. If you had an episiotomy or a perineal tear during labor and delivery, your health care provider may tell you not to take baths for a certain length of time.  Wear a bra that supports your breasts and fits you well.  If possible, have someone help you with household activities and help care for your baby  for at least a few days after you leave the hospital.  Keep all follow-up visits for you and your baby as told by your health care provider. This is important. Contact a health care provider if:  You have:  Vaginal discharge that has a bad smell.  Difficulty urinating.  Pain when urinating.  A sudden increase or decrease in the frequency of your bowel movements.  More redness, swelling, or pain around your episiotomy or vaginal tear.  More fluid or blood coming from your  episiotomy or vaginal tear.  Pus or a bad smell coming from your episiotomy or vaginal tear.  A fever.  A rash.  Little or no interest in activities you used to enjoy.  Questions about caring for yourself or your baby.  Your episiotomy or vaginal tear feels warm to the touch.  Your episiotomy or vaginal tear is separating or does not appear to be healing.  Your breasts are painful, hard, or turn red.  You feel unusually sad or worried.  You feel nauseous or you vomit.  You pass large blood clots from your vagina. If you pass a blood clot from your vagina, save it to show to your health care provider. Do not flush blood clots down the toilet without having your health care provider look at them.  You urinate more than usual.  You are dizzy or light-headed.  You have not breastfed at all and you have not had a menstrual period for 12 weeks after delivery.  You have stopped breastfeeding and you have not had a menstrual period for 12 weeks after you stopped breastfeeding. Get help right away if:  You have:  Pain that does not go away or does not get better with medicine.  Chest pain.  Difficulty breathing.  Blurred vision or spots in your vision.  Thoughts about hurting yourself or your baby.  You develop pain in your abdomen or in one of your legs.  You develop a severe headache.  You faint.  You bleed from your vagina so much that you fill two sanitary pads in one hour. This information is not intended to replace advice given to you by your health care provider. Make sure you discuss any questions you have with your health care provider. Document Released: 12/26/1999 Document Revised: 06/11/2015 Document Reviewed: 01/12/2015 Elsevier Interactive Patient Education  2017 [employer] Discharge instructions:   Call office if you have any of the following: headache, visual changes, fever >101.0 F, chills, breast concerns, excessive vaginal bleeding, incision  drainage or problems, leg pain or redness, depression or any other concerns.   Activity: Do not lift > 10 lbs for 6 weeks.  No intercourse or tampons for 6 weeks.  No driving for 1-2 weeks.   Call your doctor for increased pain or vaginal bleeding, temperature above 101.0, depression, or concerns.  No strenuous activity or heavy lifting for 6 weeks.  No intercourse, tampons, douching, or enemas for 6 weeks.  No tub baths-showers only.  No driving for 2 weeks or while taking pain medications.  Continue prenatal vitamin and iron.  Increase calories and fluids while breastfeeding.  You may have a slight fever when your milk comes in, but it should go away on its own.  If it does not, and rises above 101.0 please call the doctor.  For concerns about your baby, please call your pediatrician For breastfeeding concerns, the lactation consultant can be reached at [phone]      Vaginal Delivery, Care After  Refer to this sheet in the next few weeks. These instructions provide you with information about caring for yourself after vaginal delivery. Your health care provider may also give you more specific instructions. Your treatment has been planned according to current medical practices, but problems sometimes occur. Call your health care provider if you have any problems or questions. What can I expect after the procedure? After vaginal delivery, it is common to have:  Some bleeding from your vagina.  Soreness in your abdomen, your vagina, and the area of skin between your vaginal opening and your anus (perineum).  Pelvic cramps.  Fatigue. Follow these instructions at home: Medicines   Take over-the-counter and prescription medicines only as told by your health care provider.  If you were prescribed an antibiotic medicine, take it as told by your health care provider. Do not stop taking the antibiotic until it is finished. Driving    Do not drive or operate heavy machinery while  taking prescription pain medicine.  Do not drive for 24 hours if you received a sedative. Lifestyle   Do not drink alcohol. This is especially important if you are breastfeeding or taking medicine to relieve pain.  Do not use tobacco products, including cigarettes, chewing tobacco, or e-cigarettes. If you need help quitting, ask your health care provider. Eating and drinking   Drink at least 8 eight-ounce glasses of water every day unless you are told not to by your health care provider. If you choose to breastfeed your baby, you may need to drink more water than this.  Eat high-fiber foods every day. These foods may help prevent or relieve constipation. High-fiber foods include:  Whole grain cereals and breads.  Brown rice.  Beans.  Fresh fruits and vegetables. Activity   Return to your normal activities as told by your health care provider. Ask your health care provider what activities are safe for you.  Rest as much as possible. Try to rest or take a nap when your baby is sleeping.  Do not lift anything that is heavier than your baby or 10 lb (4.5 kg) until your health care provider says that it is safe.  Talk with your health care provider about when you can engage in sexual activity. This may depend on your:  Risk of infection.  Rate of healing.  Comfort and desire to engage in sexual activity. Vaginal Care   If you have an episiotomy or a vaginal tear, check the area every day for signs of infection. Check for:  More redness, swelling, or pain.  More fluid or blood.  Warmth.  Pus or a bad smell.  Do not use tampons or douches until your health care provider says this is safe.  Watch for any blood clots that may pass from your vagina. These may look like clumps of dark red, brown, or black discharge. General instructions   Keep your perineum clean and dry as told by your health care provider.  Wear loose, comfortable clothing.  Wipe from front to back  when you use the toilet.  Ask your health care provider if you can shower or take a bath. If you had an episiotomy or a perineal tear during labor and delivery, your health care provider may tell you not to take baths for a certain length of time.  Wear a bra that supports your breasts and fits you well.  If possible, have someone help you with household activities and help care for your baby for at least a  few days after you leave the hospital.  Keep all follow-up visits for you and your baby as told by your health care provider. This is important. Contact a health care provider if:  You have:  Vaginal discharge that has a bad smell.  Difficulty urinating.  Pain when urinating.  A sudden increase or decrease in the frequency of your bowel movements.  More redness, swelling, or pain around your episiotomy or vaginal tear.  More fluid or blood coming from your episiotomy or vaginal tear.  Pus or a bad smell coming from your episiotomy or vaginal tear.  A fever.  A rash.  Little or no interest in activities you used to enjoy.  Questions about caring for yourself or your baby.  Your episiotomy or vaginal tear feels warm to the touch.  Your episiotomy or vaginal tear is separating or does not appear to be healing.  Your breasts are painful, hard, or turn red.  You feel unusually sad or worried.  You feel nauseous or you vomit.  You pass large blood clots from your vagina. If you pass a blood clot from your vagina, save it to show to your health care provider. Do not flush blood clots down the toilet without having your health care provider look at them.  You urinate more than usual.  You are dizzy or light-headed.  You have not breastfed at all and you have not had a menstrual period for 12 weeks after delivery.  You have stopped breastfeeding and you have not had a menstrual period for 12 weeks after you stopped breastfeeding. Get help right away if:  You  have:  Pain that does not go away or does not get better with medicine.  Chest pain.  Difficulty breathing.  Blurred vision or spots in your vision.  Thoughts about hurting yourself or your baby.  You develop pain in your abdomen or in one of your legs.  You develop a severe headache.  You faint.  You bleed from your vagina so much that you fill two sanitary pads in one hour. This information is not intended to replace advice given to you by your health care provider. Make sure you discuss any questions you have with your health care provider. Document Released: 12/26/1999 Document Revised: 06/11/2015 Document Reviewed: 01/12/2015 Elsevier Interactive Patient Education  2017 [employer].

## 2018-05-25 ENCOUNTER — Telehealth: Payer: Self-pay | Admitting: Family Medicine

## 2018-05-25 NOTE — Telephone Encounter (Signed)
Called and could not leave vm. Calling to verify PCP. Patient has not been seen since 2014 °

## 2019-01-12 NOTE — L&D Delivery Note (Signed)
Delivery Note  First Stage:  IOL due to severe IUGR Augmentation:  AROM, Pitocin, outpatient foley balloon Analgesia /Anesthesia intrapartum: epidural AROM at 1012  - position changes, Pitocin DC several times and then amnioinfusion due to persistent late and variable decels   Second Stage: Complete dilation at 0431, anterior lip reduced Onset of pushing at 0431 FHR second stage Cat II  Delivery of a viable female infant on 11/17/19 at 0441 by CNM,  delivery of fetal head in ROA position with restitution to ROT. triple tight nuchal cord, with additional 1 loop around her arm and 1 loop around body;  Anterior then posterior shoulders delivered easily with gentle downward traction. Baby placed on mom's chest, and attended to by peds.  Cord double clamped after cessation of pulsation, cut by FOB.  Arterial cord blood sample collected due to persistent deep variable decels.   Third Stage: Active third stage mgmt with Pitocin per protocol, Placenta delivered spontaneously intact with 3VC @ 0447 Placenta disposition: to pathology; small placenta, 4-5cm accessory lobe within membranes without vessels noted.  Uterine tone Firm / bleeding scant; Cytotec PR given for hemorrhage prophylaxis.   No vaginal, cervical or perineal lacerations identified  Anesthesia for repair: n/a  Est. Blood Loss (mL):  Complications: Cat II tracing, deep variables to 60-70bpm.   Mom to postpartum.  Baby to skin to skin care with mother for bonding then admit to SCN due to LBW 1900grams.  Newborn: Birth Weight: 1900, 4#3  Apgar Scores: 8/8 Feeding planned: breast

## 2019-04-18 ENCOUNTER — Other Ambulatory Visit: Payer: Self-pay

## 2019-04-18 DIAGNOSIS — O0993 Supervision of high risk pregnancy, unspecified, third trimester: Secondary | ICD-10-CM | POA: Insufficient documentation

## 2019-04-18 DIAGNOSIS — Z369 Encounter for antenatal screening, unspecified: Secondary | ICD-10-CM

## 2019-05-23 LAB — OB RESULTS CONSOLE RPR: RPR: NONREACTIVE

## 2019-05-23 LAB — OB RESULTS CONSOLE RUBELLA ANTIBODY, IGM: Rubella: IMMUNE

## 2019-05-23 LAB — OB RESULTS CONSOLE HEPATITIS B SURFACE ANTIGEN: Hepatitis B Surface Ag: NEGATIVE

## 2019-05-23 LAB — OB RESULTS CONSOLE VARICELLA ZOSTER ANTIBODY, IGG: Varicella: IMMUNE

## 2019-05-28 ENCOUNTER — Telehealth: Payer: Self-pay | Admitting: Obstetrics and Gynecology

## 2019-05-28 NOTE — Telephone Encounter (Signed)
Brittany Ferguson was referred by [facility] for genetic counseling and an ultrasound at [facility] on 05/31/2019 due to a possible diagnosis of Neurofibromatosis type 1 in her daughter.  However, the patient would like to cancel this visit.  After a formal evaluation and genetic testing, the daughter was negative for NF1.  She stated that her questions were answered at [facility] following that evaluation and she does not feel like she needs this visit. We encouraged her to contact us at [phone] if any additional concerns arise.  Cherly Anderson, MS, CGC

## 2019-05-31 ENCOUNTER — Telehealth: Payer: Self-pay | Admitting: Obstetrics and Gynecology

## 2019-05-31 ENCOUNTER — Ambulatory Visit: Payer: BLUE CROSS/BLUE SHIELD

## 2019-05-31 ENCOUNTER — Other Ambulatory Visit: Payer: Self-pay | Admitting: Obstetrics and Gynecology

## 2019-05-31 DIAGNOSIS — Z3689 Encounter for other specified antenatal screening: Secondary | ICD-10-CM

## 2019-05-31 NOTE — Telephone Encounter (Signed)
I spoke with Ms. Hathorne when she called to inquire about the need for an ultrasound appointment with [facility].  She stated that she had an ultrasound yesterday at [facility] at [redacted] weeks gestation.  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 She then met with Linda Hedges, CNM this morning who provided information about amniotic band syndrome that cause her to worry significantly.  Upon review of the chart note from that visit this morning, Ms. Oxley recommended a detailed anatomy ultrasound per her conversation with Dr. Leafy Ro.  I spoke with Dr. Manfred Shirts, MFM, who stated that because there was no indication in the ultrasound report that the amniotic band was impacting the fetus at this time, waiting for an anatomy ultrasound would be appropriate (as anomalies related to amniotic band syndrome would be expected to have occurred very early in gestation). If [facility] or the patient would like to schedule a detailed ultrasound with [place] in [place] or [place], that can be arranged and would usually be performed around [redacted] weeks gestation.  We may be reached at [phone].  Wilburt Finlay, MS, CGC

## 2019-06-04 ENCOUNTER — Other Ambulatory Visit: Payer: Self-pay | Admitting: Obstetrics and Gynecology

## 2019-06-04 ENCOUNTER — Ambulatory Visit
Admission: RE | Admit: 2019-06-04 | Discharge: 2019-06-04 | Disposition: A | Discharge status: Home / Self Care | Payer: Managed Care, Other (non HMO) | Source: Ambulatory Visit | Attending: Obstetrics and Gynecology | Admitting: Obstetrics and Gynecology

## 2019-06-04 ENCOUNTER — Other Ambulatory Visit: Payer: Self-pay

## 2019-06-04 ENCOUNTER — Ambulatory Visit (HOSPITAL_BASED_OUTPATIENT_CLINIC_OR_DEPARTMENT_OTHER)
Admission: RE | Admit: 2019-06-04 | Discharge: 2019-06-04 | Disposition: A | Discharge status: Home / Self Care | Payer: Managed Care, Other (non HMO) | Source: Ambulatory Visit | Attending: Obstetrics and Gynecology | Admitting: Obstetrics and Gynecology

## 2019-06-04 DIAGNOSIS — Q798 Other congenital malformations of musculoskeletal system: Secondary | ICD-10-CM

## 2019-06-04 DIAGNOSIS — O418X10 Other specified disorders of amniotic fluid and membranes, first trimester, not applicable or unspecified: Secondary | ICD-10-CM | POA: Diagnosis not present

## 2019-06-04 DIAGNOSIS — Z3A13 13 weeks gestation of pregnancy: Secondary | ICD-10-CM | POA: Insufficient documentation

## 2019-06-04 DIAGNOSIS — O3481 Maternal care for other abnormalities of pelvic organs, first trimester: Secondary | ICD-10-CM | POA: Diagnosis not present

## 2019-06-04 DIAGNOSIS — N83292 Other ovarian cyst, left side: Secondary | ICD-10-CM | POA: Diagnosis not present

## 2019-06-04 DIAGNOSIS — O468X2 Other antepartum hemorrhage, second trimester: Secondary | ICD-10-CM | POA: Insufficient documentation

## 2019-06-04 DIAGNOSIS — O468X1 Other antepartum hemorrhage, first trimester: Secondary | ICD-10-CM

## 2019-06-04 DIAGNOSIS — O418X20 Other specified disorders of amniotic fluid and membranes, second trimester, not applicable or unspecified: Secondary | ICD-10-CM | POA: Insufficient documentation

## 2019-06-04 NOTE — Progress Notes (Signed)
MFM consult limited to u/s evaluation  No additional consult done  F/u scan in 2 weeks   Jimmey Ralph

## 2019-06-18 ENCOUNTER — Ambulatory Visit: Payer: 59

## 2019-06-18 ENCOUNTER — Other Ambulatory Visit: Payer: Self-pay | Admitting: Maternal & Fetal Medicine

## 2019-06-18 DIAGNOSIS — O418X20 Other specified disorders of amniotic fluid and membranes, second trimester, not applicable or unspecified: Secondary | ICD-10-CM

## 2019-06-21 ENCOUNTER — Other Ambulatory Visit: Payer: Self-pay

## 2019-06-21 ENCOUNTER — Ambulatory Visit
Admission: RE | Admit: 2019-06-21 | Discharge: 2019-06-21 | Disposition: A | Discharge status: Home / Self Care | Payer: Managed Care, Other (non HMO) | Source: Ambulatory Visit | Attending: Obstetrics and Gynecology | Admitting: Obstetrics and Gynecology

## 2019-06-21 DIAGNOSIS — Z3A16 16 weeks gestation of pregnancy: Secondary | ICD-10-CM | POA: Diagnosis not present

## 2019-06-21 DIAGNOSIS — O418X20 Other specified disorders of amniotic fluid and membranes, second trimester, not applicable or unspecified: Secondary | ICD-10-CM | POA: Diagnosis not present

## 2019-06-21 DIAGNOSIS — O208 Other hemorrhage in early pregnancy: Secondary | ICD-10-CM | POA: Diagnosis not present

## 2019-07-09 ENCOUNTER — Ambulatory Visit: Payer: Self-pay

## 2019-09-14 ENCOUNTER — Other Ambulatory Visit: Payer: Self-pay

## 2019-09-14 ENCOUNTER — Encounter: Payer: Self-pay | Admitting: Obstetrics and Gynecology

## 2019-09-14 ENCOUNTER — Observation Stay
Admission: EM | Admit: 2019-09-14 | Discharge: 2019-09-14 | Disposition: A | Discharge status: Home / Self Care | Payer: Managed Care, Other (non HMO)

## 2019-09-14 DIAGNOSIS — Z3A28 28 weeks gestation of pregnancy: Secondary | ICD-10-CM | POA: Diagnosis not present

## 2019-09-14 DIAGNOSIS — O0993 Supervision of high risk pregnancy, unspecified, third trimester: Secondary | ICD-10-CM | POA: Diagnosis not present

## 2019-09-14 DIAGNOSIS — Z87891 Personal history of nicotine dependence: Secondary | ICD-10-CM | POA: Diagnosis not present

## 2019-09-14 DIAGNOSIS — O368130 Decreased fetal movements, third trimester, not applicable or unspecified: Secondary | ICD-10-CM | POA: Diagnosis present

## 2019-09-14 MED ORDER — CALCIUM CARBONATE ANTACID 500 MG PO CHEW: 2.0000 | CHEWABLE_TABLET | ORAL | Status: DC | PRN

## 2019-09-14 MED ORDER — ACETAMINOPHEN 325 MG PO TABS: 650.0000 mg | ORAL_TABLET | ORAL | Status: DC | PRN

## 2019-09-14 NOTE — Progress Notes (Signed)
Sent from office for fetal monitoring d/t new diagnosis of IUGR and decreased fetal movement.   [id number] with at [redacted]w[redacted]d, LMP of 03/01/2019, c/w early [place] at [redacted]w[redacted]d.   Prenatal provider: [facility] Pregnancy complicated by: 1. IUGR 2. Subchorionic hemorrhage 3. Hx of postpartum depression and anxiety   Prenatal Labs: Blood type/Rh A pos  Antibody screen neg  Rubella Immune  Varicella Immune  RPR NR  HBsAg Neg  HIV NR  GC neg  Chlamydia neg  Genetic screening negative  1 hour GTT 131  3 hour GTT N/A  GBS Unknown    Tdap: needs Flu: needs  Contraception: TBD Feeding preference: breast   ____ Margaretmary Eddy, CNM Certified Nurse Midwife [facility]  Clinic OB/GYN [facility]

## 2019-09-14 NOTE — OB Triage Note (Signed)
Pt sent from office for monitoring  

## 2019-09-14 NOTE — Discharge Summary (Signed)
Brittany Ferguson is a 34 y.o. female. She is at [redacted]w[redacted]d gestation. Patient's last menstrual period was 03/01/2019. Estimated Date of Delivery: 12/06/19    Prenatal care site: [facility]   Chief Complaint: high risk pregnancy, need for antepartum surveillance Brittany Ferguson had a prenatal visit today.  Her [place] today was concerning for IUGR with an AC of <2%.  She reported decreased fetal movement since last night. Difficult to discern baseline on fetal monitoring in office.  Sent to L&D for prolonged monitoring.     S: Resting comfortably. no CTX, no VB.no LOF,  States baby has been moving more since the office.    Maternal Medical History:  Past Medical Hx:  has a past medical history of Medical history non-contributory.  Past Surgical Hx:  has a past surgical history that includes No past surgeries.   Allergies  Allergen Reactions  . Penicillins Hives and Rash    As a child    Prior to Admission medications   Medication Sig Start Date End Date Taking? Authorizing Provider  PRENATAL 28-0.8 MG TABS Take 1 tablet by mouth every morning.   Yes [provider]     Social History: She  reports that she quit smoking about 3 years ago. Her smoking use included cigarettes. She smoked 0.25 packs per day. She has never used smokeless tobacco. She reports that she does not drink alcohol and does not use drugs.  Family History: family history includes Cancer in her maternal grandmother, paternal aunt, and paternal grandmother; Depression in her mother; Heart disease in her brother and father; Hyperlipidemia in her father; Hypertension in her father.  no history of gyn cancers  Review of Systems: A full review of systems was performed and negative except as noted in the HPI.   O:  BP (!) 111/54 (BP Location: Right Arm)   Pulse 83   Temp 98.2 F (36.8 C) (Oral)   Resp 17   LMP 03/01/2019  No results found for this or any previous visit (from the past 48 hour(s)).    Constitutional: NAD, AAOx3  HE/ENT: extraocular movements grossly intact, moist mucous membranes CV: RRR PULM: nl respiratory effort, CTABL     Abd: gravid, non-tender, non-distended, soft      Ext: Non-tender, Nonedmeatous   Psych: mood appropriate, speech normal Pelvic: deferred   NST: Baseline: 135 Variability: moderate Accelerations present x >2 Decelerations absent Toco: occasional mild contractions    A/P: 34 y.o. [redacted]w[redacted]d with high risk pregnancy and antepartum surveillance.   Labor: not present.   Fetal Wellbeing: Reassuring Cat 1 tracing.  Reactive NST   Urgent MFM referral placed for further assessment of IUGR - discussed that if an appointment for MFM was unable to be made next week to be seen in office next week.   D/c home stable, precautions reviewed, follow-up in office in 2 weeks   ----- Margaretmary Eddy, CNM Certified Nurse Midwife [place]  [facility] OB[facility]N [facility]

## 2019-09-22 ENCOUNTER — Other Ambulatory Visit: Payer: Self-pay

## 2019-09-22 DIAGNOSIS — O418X20 Other specified disorders of amniotic fluid and membranes, second trimester, not applicable or unspecified: Secondary | ICD-10-CM

## 2019-09-24 ENCOUNTER — Other Ambulatory Visit: Payer: Self-pay

## 2019-09-24 ENCOUNTER — Other Ambulatory Visit: Payer: Self-pay | Admitting: *Deleted

## 2019-09-24 ENCOUNTER — Ambulatory Visit (HOSPITAL_BASED_OUTPATIENT_CLINIC_OR_DEPARTMENT_OTHER): Payer: Managed Care, Other (non HMO) | Admitting: Maternal & Fetal Medicine

## 2019-09-24 ENCOUNTER — Ambulatory Visit: Payer: Managed Care, Other (non HMO) | Attending: Maternal & Fetal Medicine

## 2019-09-24 DIAGNOSIS — O418X20 Other specified disorders of amniotic fluid and membranes, second trimester, not applicable or unspecified: Secondary | ICD-10-CM | POA: Insufficient documentation

## 2019-09-24 DIAGNOSIS — Z3A29 29 weeks gestation of pregnancy: Secondary | ICD-10-CM

## 2019-09-24 DIAGNOSIS — O365930 Maternal care for other known or suspected poor fetal growth, third trimester, not applicable or unspecified: Secondary | ICD-10-CM

## 2019-09-24 DIAGNOSIS — O365990 Maternal care for other known or suspected poor fetal growth, unspecified trimester, not applicable or unspecified: Secondary | ICD-10-CM

## 2019-09-24 DIAGNOSIS — O468X2 Other antepartum hemorrhage, second trimester: Secondary | ICD-10-CM

## 2019-09-24 NOTE — Consult Note (Signed)
MFM Consult Note  Brittany Ferguson is a G4P3 who is here for follow up growth due to IUGR seen on an outside exam at the request of Brittany Ferguson, CNM  She has a known history for subchorionic hematoma in 06/21/19 that was 7 x 2 x 6 cm.  She had a recent visit with Brittany Ferguson with IUGR EFW 20% and AC <1 % normal UA Dopplers on 09/20/19.  She has a low risk cell free DNA.  Today normal anatomy with measurements less than dates due to IUGR EFW 1% AC< 1%.  There is good fetal movement and amniotic fluid volume The UA Dopplers are normal.  Subchorionic hematoma is not visualized today.  She reports good fetal movement  I discussed today's visit with and the diagnosis of IUGR. I explained that the etiology includes placental insufficiency, chronic disease, infection, aneuploidy and other genetic syndromes. She has a low risk NIPS She has no additional risk factors for chronic disease.   At this time I explained the diagnosis, evaluation and management to include on going fetal growth and weekly antenatal testing to include UA Dopplers.    I recommend delivery at 37 weeks given EFW <1% and continued weekly testing with UA Dopplers.  We scheduled these studies here today however, I informed Brittany Ferguson that if these studies can be performed at your offices I have asked Brittany Ferguson to cancel these studies.   I spent 30 minutes with > 50% in face to face consultation and care coordination.    All questions answered  [name], MD.

## 2019-10-05 ENCOUNTER — Ambulatory Visit: Payer: Managed Care, Other (non HMO) | Admitting: *Deleted

## 2019-10-05 ENCOUNTER — Other Ambulatory Visit: Payer: Self-pay

## 2019-10-05 ENCOUNTER — Ambulatory Visit: Payer: Managed Care, Other (non HMO) | Attending: Maternal & Fetal Medicine

## 2019-10-05 VITALS — BP 111/72 | HR 72

## 2019-10-05 DIAGNOSIS — O321XX0 Maternal care for breech presentation, not applicable or unspecified: Secondary | ICD-10-CM | POA: Diagnosis not present

## 2019-10-05 DIAGNOSIS — O365990 Maternal care for other known or suspected poor fetal growth, unspecified trimester, not applicable or unspecified: Secondary | ICD-10-CM

## 2019-10-05 DIAGNOSIS — Z3A31 31 weeks gestation of pregnancy: Secondary | ICD-10-CM | POA: Diagnosis not present

## 2019-10-05 DIAGNOSIS — O365931 Maternal care for other known or suspected poor fetal growth, third trimester, fetus 1: Secondary | ICD-10-CM | POA: Diagnosis not present

## 2019-10-05 NOTE — Procedures (Signed)
Brittany Ferguson [birth year] [redacted]w[redacted]d  Fetus A Non-Stress Test Interpretation for 10/05/19  Indication: IUGR  Fetal Heart Rate A Mode: External Baseline Rate (A): 130 bpm Variability: Moderate Accelerations: 10 x 10 Decelerations: Variable Multiple birth?: No  Uterine Activity Mode: Palpation, Toco Contraction Frequency (min): none noted Resting Tone Palpated: Relaxed Resting Time: Adequate  Interpretation (Fetal Testing) Nonstress Test Interpretation: Reactive Comments: Reviewed tracing with Dr. Judeth Cornfield

## 2019-10-09 ENCOUNTER — Other Ambulatory Visit: Payer: Self-pay | Admitting: Obstetrics and Gynecology

## 2019-10-09 NOTE — Progress Notes (Signed)
Scheduled BMZ injections on 9/30 and 10/1 due to IUGR.

## 2019-10-11 ENCOUNTER — Observation Stay
Admission: RE | Admit: 2019-10-11 | Discharge: 2019-10-11 | Disposition: A | Discharge status: Home / Self Care | Payer: Managed Care, Other (non HMO) | Attending: Obstetrics and Gynecology | Admitting: Obstetrics and Gynecology

## 2019-10-11 DIAGNOSIS — Z7689 Persons encountering health services in other specified circumstances: Principal | ICD-10-CM | POA: Insufficient documentation

## 2019-10-11 MED ORDER — BETAMETHASONE SOD PHOS & ACET 6 (3-3) MG/ML IJ SUSP
12.0000 mg | INTRAMUSCULAR | Status: DC
  Administered 2019-10-11: 12 mg via INTRAMUSCULAR

## 2019-10-11 NOTE — OB Triage Note (Signed)
Pt here for first dose of BMZ. Pt discharged home and instructed to return to L&D tomorrow for her second dose

## 2019-10-12 ENCOUNTER — Other Ambulatory Visit: Payer: Self-pay

## 2019-10-12 ENCOUNTER — Ambulatory Visit: Payer: Managed Care, Other (non HMO) | Admitting: *Deleted

## 2019-10-12 ENCOUNTER — Encounter: Payer: Self-pay | Admitting: *Deleted

## 2019-10-12 ENCOUNTER — Inpatient Hospital Stay
Admission: RE | Admit: 2019-10-12 | Discharge: 2019-10-12 | Disposition: A | Discharge status: Home / Self Care | Payer: Managed Care, Other (non HMO) | Attending: Obstetrics and Gynecology | Admitting: Obstetrics and Gynecology

## 2019-10-12 ENCOUNTER — Other Ambulatory Visit: Payer: Self-pay | Admitting: Maternal & Fetal Medicine

## 2019-10-12 ENCOUNTER — Ambulatory Visit: Payer: Managed Care, Other (non HMO) | Attending: Maternal & Fetal Medicine

## 2019-10-12 VITALS — BP 112/75 | HR 67

## 2019-10-12 DIAGNOSIS — Z3A33 33 weeks gestation of pregnancy: Secondary | ICD-10-CM | POA: Insufficient documentation

## 2019-10-12 DIAGNOSIS — O365990 Maternal care for other known or suspected poor fetal growth, unspecified trimester, not applicable or unspecified: Secondary | ICD-10-CM

## 2019-10-12 DIAGNOSIS — O365930 Maternal care for other known or suspected poor fetal growth, third trimester, not applicable or unspecified: Secondary | ICD-10-CM | POA: Insufficient documentation

## 2019-10-12 DIAGNOSIS — O26893 Other specified pregnancy related conditions, third trimester: Secondary | ICD-10-CM | POA: Diagnosis not present

## 2019-10-12 MED ORDER — BETAMETHASONE SOD PHOS & ACET 6 (3-3) MG/ML IJ SUSP
INTRAMUSCULAR | Status: AC
  Administered 2019-10-12: 12 mg via INTRAMUSCULAR
  Filled 2019-10-12: qty 5

## 2019-10-12 MED ORDER — BETAMETHASONE SOD PHOS & ACET 6 (3-3) MG/ML IJ SUSP: 12.0000 mg | Freq: Once | INTRAMUSCULAR | Status: AC

## 2019-10-12 NOTE — Procedures (Signed)
Brittany Ferguson [birth year] [redacted]w[redacted]d  Fetus A Non-Stress Test Interpretation for 10/12/19  Indication: IUGR  Fetal Heart Rate A Mode: External Baseline Rate (A): 120 bpm Variability: Moderate Accelerations: 15 x 15 Decelerations: None Multiple birth?: No  Uterine Activity Mode: Palpation, Toco Contraction Frequency (min): Occcas. UI Contraction Quality: Mild Resting Tone Palpated: Relaxed Resting Time: Adequate  Interpretation (Fetal Testing) Nonstress Test Interpretation: Reactive Comments: Dr. Parke Poisson reviewed tracing.

## 2019-10-19 ENCOUNTER — Ambulatory Visit: Payer: Managed Care, Other (non HMO) | Attending: Maternal & Fetal Medicine

## 2019-10-19 ENCOUNTER — Ambulatory Visit: Payer: Managed Care, Other (non HMO) | Admitting: *Deleted

## 2019-10-19 ENCOUNTER — Other Ambulatory Visit: Payer: Self-pay

## 2019-10-19 ENCOUNTER — Other Ambulatory Visit: Payer: Self-pay | Admitting: *Deleted

## 2019-10-19 ENCOUNTER — Other Ambulatory Visit: Payer: Self-pay | Admitting: Maternal & Fetal Medicine

## 2019-10-19 VITALS — BP 113/71 | HR 84

## 2019-10-19 DIAGNOSIS — O365990 Maternal care for other known or suspected poor fetal growth, unspecified trimester, not applicable or unspecified: Secondary | ICD-10-CM

## 2019-10-19 DIAGNOSIS — O365930 Maternal care for other known or suspected poor fetal growth, third trimester, not applicable or unspecified: Secondary | ICD-10-CM | POA: Diagnosis not present

## 2019-10-19 DIAGNOSIS — Z3A33 33 weeks gestation of pregnancy: Secondary | ICD-10-CM | POA: Diagnosis not present

## 2019-10-19 DIAGNOSIS — Z362 Encounter for other antenatal screening follow-up: Secondary | ICD-10-CM | POA: Diagnosis not present

## 2019-10-26 ENCOUNTER — Ambulatory Visit: Payer: Managed Care, Other (non HMO) | Admitting: *Deleted

## 2019-10-26 ENCOUNTER — Encounter: Payer: Self-pay | Admitting: *Deleted

## 2019-10-26 ENCOUNTER — Ambulatory Visit: Payer: Managed Care, Other (non HMO) | Attending: Maternal & Fetal Medicine

## 2019-10-26 ENCOUNTER — Other Ambulatory Visit: Payer: Self-pay

## 2019-10-26 ENCOUNTER — Ambulatory Visit: Payer: Managed Care, Other (non HMO)

## 2019-10-26 VITALS — BP 115/67 | HR 76

## 2019-10-26 DIAGNOSIS — O365930 Maternal care for other known or suspected poor fetal growth, third trimester, not applicable or unspecified: Secondary | ICD-10-CM | POA: Insufficient documentation

## 2019-10-26 DIAGNOSIS — Z3A34 34 weeks gestation of pregnancy: Secondary | ICD-10-CM | POA: Diagnosis not present

## 2019-10-26 DIAGNOSIS — O365990 Maternal care for other known or suspected poor fetal growth, unspecified trimester, not applicable or unspecified: Secondary | ICD-10-CM | POA: Diagnosis present

## 2019-10-26 DIAGNOSIS — Z362 Encounter for other antenatal screening follow-up: Secondary | ICD-10-CM

## 2019-10-26 NOTE — Procedures (Signed)
Dea Bitting October 19, 1985 [redacted]w[redacted]d  Fetus A Non-Stress Test Interpretation for 10/26/19  Indication: IUGR  Fetal Heart Rate A Mode: External Baseline Rate (A): 135 bpm Variability: Moderate Accelerations: 15 x 15 Decelerations: None Multiple birth?: No  Uterine Activity Mode: Palpation, Toco Contraction Frequency (min): None Resting Tone Palpated: Relaxed Resting Time: Adequate  Interpretation (Fetal Testing) Nonstress Test Interpretation: Reactive Comments: Dr. Parke Poisson reviewed tracing.

## 2019-11-02 ENCOUNTER — Encounter: Payer: Self-pay | Admitting: *Deleted

## 2019-11-02 ENCOUNTER — Ambulatory Visit: Payer: Managed Care, Other (non HMO) | Attending: Obstetrics and Gynecology | Admitting: *Deleted

## 2019-11-02 ENCOUNTER — Ambulatory Visit (HOSPITAL_BASED_OUTPATIENT_CLINIC_OR_DEPARTMENT_OTHER): Payer: Managed Care, Other (non HMO)

## 2019-11-02 ENCOUNTER — Ambulatory Visit: Payer: Managed Care, Other (non HMO) | Admitting: *Deleted

## 2019-11-02 ENCOUNTER — Other Ambulatory Visit: Payer: Self-pay

## 2019-11-02 ENCOUNTER — Ambulatory Visit: Payer: Managed Care, Other (non HMO)

## 2019-11-02 VITALS — BP 107/63 | HR 76

## 2019-11-02 DIAGNOSIS — Z362 Encounter for other antenatal screening follow-up: Secondary | ICD-10-CM

## 2019-11-02 DIAGNOSIS — Z3A35 35 weeks gestation of pregnancy: Secondary | ICD-10-CM | POA: Diagnosis not present

## 2019-11-02 DIAGNOSIS — O365930 Maternal care for other known or suspected poor fetal growth, third trimester, not applicable or unspecified: Secondary | ICD-10-CM

## 2019-11-02 DIAGNOSIS — O365990 Maternal care for other known or suspected poor fetal growth, unspecified trimester, not applicable or unspecified: Secondary | ICD-10-CM

## 2019-11-02 NOTE — Procedures (Signed)
Brittany Ferguson 1985-07-23 [redacted]w[redacted]d  Fetus A Non-Stress Test Interpretation for 11/02/19  Indication: IUGR  Fetal Heart Rate A Mode: External Baseline Rate (A): 135 bpm Variability: Moderate Accelerations: 15 x 15 Decelerations: None Multiple birth?: No  Uterine Activity Mode: Palpation, Toco Contraction Frequency (min): none noted Resting Tone Palpated: Relaxed Resting Time: Adequate  Interpretation (Fetal Testing) Nonstress Test Interpretation: Reactive Comments: Reviewed tracing with Dr. Parke Poisson

## 2019-11-06 LAB — OB RESULTS CONSOLE GBS: GBS: NEGATIVE

## 2019-11-06 LAB — OB RESULTS CONSOLE GC/CHLAMYDIA
Chlamydia: NEGATIVE
Gonorrhea: NEGATIVE

## 2019-11-06 LAB — OB RESULTS CONSOLE HIV ANTIBODY (ROUTINE TESTING): HIV: NONREACTIVE

## 2019-11-09 ENCOUNTER — Ambulatory Visit: Payer: Managed Care, Other (non HMO) | Attending: Obstetrics and Gynecology | Admitting: *Deleted

## 2019-11-09 ENCOUNTER — Ambulatory Visit: Payer: Managed Care, Other (non HMO)

## 2019-11-09 ENCOUNTER — Other Ambulatory Visit: Payer: Self-pay

## 2019-11-09 ENCOUNTER — Ambulatory Visit (HOSPITAL_BASED_OUTPATIENT_CLINIC_OR_DEPARTMENT_OTHER): Payer: Managed Care, Other (non HMO)

## 2019-11-09 ENCOUNTER — Encounter: Payer: Self-pay | Admitting: *Deleted

## 2019-11-09 VITALS — BP 110/68 | HR 81

## 2019-11-09 DIAGNOSIS — Z3A36 36 weeks gestation of pregnancy: Secondary | ICD-10-CM | POA: Diagnosis not present

## 2019-11-09 DIAGNOSIS — O365930 Maternal care for other known or suspected poor fetal growth, third trimester, not applicable or unspecified: Secondary | ICD-10-CM | POA: Insufficient documentation

## 2019-11-09 DIAGNOSIS — O365990 Maternal care for other known or suspected poor fetal growth, unspecified trimester, not applicable or unspecified: Secondary | ICD-10-CM

## 2019-11-09 DIAGNOSIS — Z362 Encounter for other antenatal screening follow-up: Secondary | ICD-10-CM

## 2019-11-09 NOTE — Procedures (Signed)
Brittany Ferguson [birth year] [redacted]w[redacted]d  Fetus A Non-Stress Test Interpretation for 11/09/19  Indication: IUGR  Fetal Heart Rate A Mode: External Baseline Rate (A): 145 bpm Variability: Moderate Accelerations: 15 x 15 Decelerations: None Multiple birth?: No  Uterine Activity Mode: Palpation, Toco Contraction Frequency (min): 1-4 Contraction Duration (sec): 30-70 Contraction Quality: Mild Resting Tone Palpated: Relaxed Resting Time: Adequate  Interpretation (Fetal Testing) Nonstress Test Interpretation: Reactive Comments: Reviewed tracing with Dr. Grace Bushy

## 2019-11-14 ENCOUNTER — Other Ambulatory Visit: Payer: Self-pay

## 2019-11-14 ENCOUNTER — Other Ambulatory Visit
Admission: RE | Admit: 2019-11-14 | Discharge: 2019-11-14 | Disposition: A | Discharge status: Home / Self Care | Payer: Managed Care, Other (non HMO) | Source: Ambulatory Visit | Attending: Obstetrics & Gynecology | Admitting: Obstetrics & Gynecology

## 2019-11-14 DIAGNOSIS — Z01812 Encounter for preprocedural laboratory examination: Secondary | ICD-10-CM | POA: Insufficient documentation

## 2019-11-14 DIAGNOSIS — O209 Hemorrhage in early pregnancy, unspecified: Secondary | ICD-10-CM | POA: Insufficient documentation

## 2019-11-14 DIAGNOSIS — Z20822 Contact with and (suspected) exposure to covid-19: Secondary | ICD-10-CM | POA: Insufficient documentation

## 2019-11-14 DIAGNOSIS — O365931 Maternal care for other known or suspected poor fetal growth, third trimester, fetus 1: Secondary | ICD-10-CM | POA: Insufficient documentation

## 2019-11-14 LAB — SARS CORONAVIRUS 2 (TAT 6-24 HRS): SARS Coronavirus 2: NEGATIVE

## 2019-11-14 NOTE — Progress Notes (Signed)
[id number] with at [redacted]w[redacted]d, LMP of 03/01/2019, c/w early [place] at [redacted]w[redacted]d.  Scheduled for induction of labor for IUGR <1% on 11/16/2019.   Prenatal provider: [facility] Pregnancy complicated by: 1. Asymmetric fetal growth restriction  2. History of postpartum depression and anxiety 3. Subchorionic hematoma - resolved  4. Oldest daughter dx with suspected neurofibromatosis Type 1  Prenatal Labs: Blood type/Rh A pos  Antibody screen neg  Rubella Immune  Varicella Immune  RPR NR  HBsAg Neg  HIV NR  GC neg  Chlamydia neg  Genetic screening negative  1 hour GTT 131  3 hour GTT N/A  GBS Negative    Tdap: uknown Flu: declined  Contraception: TBD Feeding preference: Breast   ____ Brittany Ferguson, CNM Certified Nurse Midwife [facility]  Clinic OB/GYN [facility]

## 2019-11-15 MED ORDER — MISOPROSTOL 25 MCG QUARTER TABLET: 25.0000 ug | ORAL_TABLET | ORAL | Status: DC | PRN

## 2019-11-15 MED ORDER — BETAMETHASONE SOD PHOS & ACET 6 (3-3) MG/ML IJ SUSP: 12.0000 mg | INTRAMUSCULAR | Status: AC

## 2019-11-15 NOTE — Addendum Note (Signed)
Addended by: Vanetta Rule A on: 11/15/2019 05:09 PM   Modules accepted: Orders, SmartSet

## 2019-11-16 ENCOUNTER — Inpatient Hospital Stay
Admission: EM | Admit: 2019-11-16 | Discharge: 2019-11-19 | DRG: 807 | Disposition: A | Discharge status: Home / Self Care | Payer: Managed Care, Other (non HMO) | Attending: Obstetrics and Gynecology | Admitting: Obstetrics and Gynecology

## 2019-11-16 ENCOUNTER — Inpatient Hospital Stay: Payer: Managed Care, Other (non HMO) | Admitting: Anesthesiology

## 2019-11-16 ENCOUNTER — Encounter: Payer: Self-pay | Admitting: Obstetrics and Gynecology

## 2019-11-16 ENCOUNTER — Other Ambulatory Visit: Payer: Self-pay

## 2019-11-16 DIAGNOSIS — Z3A37 37 weeks gestation of pregnancy: Secondary | ICD-10-CM | POA: Diagnosis not present

## 2019-11-16 DIAGNOSIS — Z20822 Contact with and (suspected) exposure to covid-19: Secondary | ICD-10-CM | POA: Diagnosis present

## 2019-11-16 DIAGNOSIS — D509 Iron deficiency anemia, unspecified: Secondary | ICD-10-CM | POA: Diagnosis present

## 2019-11-16 DIAGNOSIS — Z880 Allergy status to penicillin: Secondary | ICD-10-CM

## 2019-11-16 DIAGNOSIS — O365930 Maternal care for other known or suspected poor fetal growth, third trimester, not applicable or unspecified: Principal | ICD-10-CM | POA: Diagnosis present

## 2019-11-16 DIAGNOSIS — Z87891 Personal history of nicotine dependence: Secondary | ICD-10-CM

## 2019-11-16 DIAGNOSIS — O9902 Anemia complicating childbirth: Secondary | ICD-10-CM | POA: Diagnosis present

## 2019-11-16 LAB — TYPE AND SCREEN
ABO/RH(D): A POS
Antibody Screen: NEGATIVE

## 2019-11-16 LAB — CBC
HCT: 30.4 % — ABNORMAL LOW (ref 36.0–46.0)
Hemoglobin: 9.9 g/dL — ABNORMAL LOW (ref 12.0–15.0)
MCH: 28 pg (ref 26.0–34.0)
MCHC: 32.6 g/dL (ref 30.0–36.0)
MCV: 85.9 fL (ref 80.0–100.0)
Platelets: 295 10*3/uL (ref 150–400)
RBC: 3.54 MIL/uL — ABNORMAL LOW (ref 3.87–5.11)
RDW: 13.1 % (ref 11.5–15.5)
WBC: 10.4 10*3/uL (ref 4.0–10.5)
nRBC: 0 % (ref 0.0–0.2)

## 2019-11-16 LAB — RPR: RPR Ser Ql: NONREACTIVE

## 2019-11-16 MED ORDER — ACETAMINOPHEN 500 MG PO TABS: 1000.0000 mg | ORAL_TABLET | Freq: Four times a day (QID) | ORAL | Status: DC | PRN

## 2019-11-16 MED ORDER — SODIUM CHLORIDE 0.9 % IV SOLN: 250.0000 mL | INTRAVENOUS | Status: DC | PRN

## 2019-11-16 MED ORDER — EPHEDRINE 5 MG/ML INJ
10.0000 mg | INTRAVENOUS | Status: DC | PRN
  Filled 2019-11-16: qty 4

## 2019-11-16 MED ORDER — PHENYLEPHRINE 40 MCG/ML (10ML) SYRINGE FOR IV PUSH (FOR BLOOD PRESSURE SUPPORT): 80.0000 ug | PREFILLED_SYRINGE | INTRAVENOUS | Status: DC | PRN

## 2019-11-16 MED ORDER — FENTANYL CITRATE (PF) 100 MCG/2ML IJ SOLN: 50.0000 ug | INTRAMUSCULAR | Status: DC | PRN

## 2019-11-16 MED ORDER — LACTATED RINGERS IV SOLN: 500.0000 mL | Freq: Once | INTRAVENOUS | Status: DC

## 2019-11-16 MED ORDER — LIDOCAINE-EPINEPHRINE (PF) 1.5 %-1:200000 IJ SOLN
INTRAMUSCULAR | Status: DC | PRN
  Administered 2019-11-16: 3 mL via EPIDURAL

## 2019-11-16 MED ORDER — SODIUM CHLORIDE 0.9% FLUSH: 3.0000 mL | INTRAVENOUS | Status: DC | PRN

## 2019-11-16 MED ORDER — LIDOCAINE HCL (PF) 1 % IJ SOLN
30.0000 mL | INTRAMUSCULAR | Status: DC | PRN
  Filled 2019-11-16: qty 30

## 2019-11-16 MED ORDER — OXYTOCIN-SODIUM CHLORIDE 30-0.9 UT/500ML-% IV SOLN
2.5000 [IU]/h | INTRAVENOUS | Status: DC
  Administered 2019-11-17: 2.5 [IU]/h via INTRAVENOUS
  Filled 2019-11-16: qty 500

## 2019-11-16 MED ORDER — TERBUTALINE SULFATE 1 MG/ML IJ SOLN: 0.2500 mg | Freq: Once | INTRAMUSCULAR | Status: DC | PRN

## 2019-11-16 MED ORDER — LACTATED RINGERS IV SOLN
500.0000 mL | INTRAVENOUS | Status: DC | PRN
  Administered 2019-11-16: 500 mL via INTRAVENOUS

## 2019-11-16 MED ORDER — LIDOCAINE HCL (PF) 1 % IJ SOLN
INTRAMUSCULAR | Status: DC | PRN
  Administered 2019-11-16: 3 mL via SUBCUTANEOUS

## 2019-11-16 MED ORDER — OXYTOCIN-SODIUM CHLORIDE 30-0.9 UT/500ML-% IV SOLN
1.0000 m[IU]/min | INTRAVENOUS | Status: DC
  Administered 2019-11-16: 2 m[IU]/min via INTRAVENOUS
  Administered 2019-11-17: 4 m[IU]/min via INTRAVENOUS

## 2019-11-16 MED ORDER — AMMONIA AROMATIC IN INHA
RESPIRATORY_TRACT | Status: AC
  Filled 2019-11-16: qty 10

## 2019-11-16 MED ORDER — FENTANYL 2.5 MCG/ML W/ROPIVACAINE 0.15% IN NS 100 ML EPIDURAL (ARMC)
12.0000 mL/h | EPIDURAL | Status: DC
  Administered 2019-11-16 (×2): 12 mL/h via EPIDURAL
  Filled 2019-11-16 (×3): qty 100

## 2019-11-16 MED ORDER — ONDANSETRON HCL 4 MG/2ML IJ SOLN: 4.0000 mg | Freq: Four times a day (QID) | INTRAMUSCULAR | Status: DC | PRN

## 2019-11-16 MED ORDER — LACTATED RINGERS IV SOLN
INTRAVENOUS | Status: DC
  Administered 2019-11-17: 125 mL/h via INTRAVENOUS

## 2019-11-16 MED ORDER — OXYTOCIN 10 UNIT/ML IJ SOLN
INTRAMUSCULAR | Status: AC
  Filled 2019-11-16: qty 2

## 2019-11-16 MED ORDER — MISOPROSTOL 200 MCG PO TABS
ORAL_TABLET | ORAL | Status: AC
  Filled 2019-11-16: qty 4

## 2019-11-16 MED ORDER — CALCIUM CARBONATE ANTACID 500 MG PO CHEW: 400.0000 mg | CHEWABLE_TABLET | Freq: Three times a day (TID) | ORAL | Status: DC | PRN

## 2019-11-16 MED ORDER — OXYTOCIN-SODIUM CHLORIDE 30-0.9 UT/500ML-% IV SOLN
1.0000 m[IU]/min | INTRAVENOUS | Status: DC
  Filled 2019-11-16: qty 500

## 2019-11-16 MED ORDER — EPHEDRINE 5 MG/ML INJ: 10.0000 mg | INTRAVENOUS | Status: DC | PRN

## 2019-11-16 MED ORDER — BUPIVACAINE HCL (PF) 0.25 % IJ SOLN
INTRAMUSCULAR | Status: DC | PRN
  Administered 2019-11-16: 4 mL via EPIDURAL
  Administered 2019-11-16: 3 mL via EPIDURAL

## 2019-11-16 MED ORDER — SODIUM CHLORIDE 0.9% FLUSH: 3.0000 mL | Freq: Two times a day (BID) | INTRAVENOUS | Status: DC

## 2019-11-16 MED ORDER — DIPHENHYDRAMINE HCL 50 MG/ML IJ SOLN: 12.5000 mg | INTRAMUSCULAR | Status: DC | PRN

## 2019-11-16 MED ORDER — OXYTOCIN BOLUS FROM INFUSION
333.0000 mL | Freq: Once | INTRAVENOUS | Status: AC
  Administered 2019-11-17: 333 mL via INTRAVENOUS

## 2019-11-16 NOTE — Anesthesia Preprocedure Evaluation (Signed)
Anesthesia Evaluation  Patient identified by MRN, date of birth, ID band Patient awake    Reviewed: Allergy & Precautions, H&P , NPO status , Patient's Chart, lab work & pertinent test results  Airway Mallampati: II  TM Distance: >3 FB Neck ROM: full    Dental  (+) Teeth Intact   Pulmonary former smoker,    Pulmonary exam normal        Cardiovascular Normal cardiovascular exam     Neuro/Psych  Headaches, PSYCHIATRIC DISORDERS Depression    GI/Hepatic negative GI ROS, Neg liver ROS,   Endo/Other    Renal/GU      Musculoskeletal   Abdominal   Peds  Hematology negative hematology ROS (+)   Anesthesia Other Findings   Reproductive/Obstetrics (+) Pregnancy                             Anesthesia Physical Anesthesia Plan  ASA: II  Anesthesia Plan: Epidural   Post-op Pain Management:    Induction:   PONV Risk Score and Plan:   Airway Management Planned:   Additional Equipment:   Intra-op Plan:   Post-operative Plan:   Informed Consent: I have reviewed the patients History and Physical, chart, labs and discussed the procedure including the risks, benefits and alternatives for the proposed anesthesia with the patient or authorized representative who has indicated his/her understanding and acceptance.     Dental Advisory Given  Plan Discussed with: Anesthesiologist and CRNA  Anesthesia Plan Comments:         Anesthesia Quick Evaluation

## 2019-11-16 NOTE — Progress Notes (Signed)
Labor Progress Note  Brittany Ferguson is a 34 y.o. G4P2003 at [redacted]w[redacted]d by ultrasound admitted for induction of labor due to Poor fetal growth.  Subjective: comfortable with epidural, CNM to room due to recurrent late decels noted.   Objective: BP 129/80   Pulse 64   Temp 98.4 F (36.9 C) (Oral)   Resp 20   Ht 5\' 7"  (1.702 m)   Wt 82.6 kg   LMP 03/01/2019   SpO2 100%   BMI 28.51 kg/m  Notable VS details: reviewed.   Fetal Assessment: FHT:  FHR: 150 bpm, variability: moderate,  accelerations:  Abscent,  decelerations:  Present variable and late decels for last 03/03/2019.   - FM interventions to include maternal position change, pitocin decrease and oxygen by FM. IUPC placed to better assess UC.  Category/reactivity:  Category II UC:   regular, every 2-4 minutes; Pitocin at 94mu/min, decreased to 28mu/min.  SVE:   4-5/75/-2, soft, anterior.  Membrane status: AROM performed at 1012 Amniotic color:  clear  Labs: Lab Results  Component Value Date   WBC 10.4 11/16/2019   HGB 9.9 (L) 11/16/2019   HCT 30.4 (L) 11/16/2019   MCV 85.9 11/16/2019   PLT 295 11/16/2019    Assessment / Plan: IOL due to severe IUGR  Labor: s/p Foley balloon, AROM and pitocin.  Preeclampsia:  no e/o pre-e Fetal Wellbeing:  Cat II with recurrent late decels, now resolved after intrauterine resuscitation.  Pain Control:  Epidural I/D:  n/a Anticipated MOD:  NSVD  13/05/2019 Blanchard Willhite, CNM 11/16/2019, 12:50 PM

## 2019-11-16 NOTE — Progress Notes (Signed)
Labor Progress Note  Brittany Ferguson is a 34 y.o. G4P2003 at [redacted]w[redacted]d by ultrasound admitted for induction of labor due to Poor fetal growth.  Subjective: comfortable with epidural, CNM to room due to recurrent late variables over last .   Objective: BP 98/66 (BP Location: Left Arm)   Pulse 64   Temp 98.1 F (36.7 C) (Oral)   Resp 18   Ht 5\' 7"  (1.702 m)   Wt 82.6 kg   LMP 03/01/2019   SpO2 96%   BMI 28.51 kg/m  Notable VS details: reviewed.   Fetal Assessment: FHT:  FHR: 140 bpm, variability: moderate,  accelerations:  Abscent,  decelerations:  Present variable late decels for last 03/03/2019.   - Position changed  Category/reactivity:  Category II UC:   regular, every 2-3 minutes; Pitocin at 42mu/min, MVUs 130-225 SVE:  7-8/80/-1, moderate bloody show noted.  Membrane status: AROM performed at 1012 Amniotic color:  clear  Labs: Lab Results  Component Value Date   WBC 10.4 11/16/2019   HGB 9.9 (L) 11/16/2019   HCT 30.4 (L) 11/16/2019   MCV 85.9 11/16/2019   PLT 295 11/16/2019    Assessment / Plan: IOL due to severe IUGR  Labor: s/p Foley balloon, AROM and pitocin.  Preeclampsia:  no e/o pre-e Fetal Wellbeing:  Category II, maternal repositioning.  Pain Control:  Epidural I/D:  n/a Anticipated MOD:  NSVD  13/05/2019, CNM 11/16/2019, 10:31 PM

## 2019-11-16 NOTE — H&P (Signed)
OB History & Physical   History of Present Illness:  Chief Complaint: induction due to severe IUGR  HPI:  Brittany Ferguson is a 34 y.o. G105P2003 female at [redacted]w[redacted]d dated by [place] at [redacted]w[redacted]d.  She presents to L&D for induction due to severe IUGR, EFW < 1%, last EFW: 1806g, 4#0 on 10/30/19  Reports Active FM, denies LOF, having some bloody show, mild UCs overnight.   - s/p outpatient foley balloon, out around 1830 yesterday.  - Low dose Pitocin overnight, titrating up now.     Pregnancy Issues: 1. Asymmetric fetal growth restriction 2. Iron deficiency Anemia, on iron supplement  3. History of postpartum depression and anxiety 4. Subchorionic hematoma - resolved  5. Oldest daughter dx with suspected neurofibromatosis Type 1  Maternal Medical History:   Past Medical History:  Diagnosis Date  . Medical history non-contributory     Past Surgical History:  Procedure Laterality Date  . NO PAST SURGERIES      Allergies  Allergen Reactions  . Penicillins Hives and Rash    As a child    Prior to Admission medications   Medication Sig Start Date End Date Taking? Authorizing Provider  PRENATAL 28-0.8 MG TABS Take 1 tablet by mouth every morning.   Yes [provider]     Prenatal care site: [facility]  Social History: She  reports that she quit smoking about 3 years ago. Her smoking use included cigarettes. She smoked 0.25 packs per day. She has never used smokeless tobacco. She reports that she does not drink alcohol and does not use drugs.  Family History: family history includes Cancer in her maternal grandmother, paternal aunt, and paternal grandmother; Depression in her mother; Heart disease in her brother and father; Hyperlipidemia in her father; Hypertension in her father.   Review of Systems: A full review of systems was performed and negative except as noted in the HPI.     Physical Exam:  Vital Signs: BP 112/66 (BP Location: Left Arm)   Pulse 61   Temp  98 F (36.7 C) (Oral)   Resp 16   Ht 5\' 7"  (1.702 m)   Wt 82.6 kg   LMP 03/01/2019   SpO2 97%   BMI 28.51 kg/m  General: no acute distress.  HEENT: normocephalic, atraumatic Heart: regular rate & rhythm.  No murmurs/rubs/gallops Lungs: clear to auscultation bilaterally, normal respiratory effort Abdomen: soft, gravid, non-tender;  EFW: 4lbs Pelvic:   External: Normal external female genitalia  Cervix: Dilation: 4.5 / Effacement (%): 70 / Station: -3    Extremities: non-tender, symmetric, no edema bilaterally.  DTRs: 2+  Neurologic: Alert & oriented x 3.    Results for orders placed or performed during the hospital encounter of 11/16/19 (from the past 24 hour(s))  CBC     Status: Abnormal   Collection Time: 11/16/19  1:18 AM  Result Value Ref Range   WBC 10.4 4.0 - 10.5 K/uL   RBC 3.54 (L) 3.87 - 5.11 MIL/uL   Hemoglobin 9.9 (L) 12.0 - 15.0 g/dL   HCT 13/05/21 (L) 36 - 46 %   MCV 85.9 80.0 - 100.0 fL   MCH 28.0 26.0 - 34.0 pg   MCHC 32.6 30.0 - 36.0 g/dL   RDW 73.4 28.7 - 68.1 %   Platelets 295 150 - 400 K/uL   nRBC 0.0 0.0 - 0.2 %  Type and screen     Status: None   Collection Time: 11/16/19  1:18 AM  Result  Value Ref Range   ABO/RH(D) A POS    Antibody Screen NEG    Sample Expiration      11/19/2019,2359 Performed at [facility], [address] [place], [place] [address]     Pertinent Results:  Prenatal Labs: Blood type/Rh A pos  Antibody screen neg  Rubella Immune  Varicella Immune  RPR NR  HBsAg Neg  HIV NR  GC neg  Chlamydia neg  Genetic screening negative  1 hour GTT  131  3 hour GTT  n/a  GBS  negative   FHT: 135bpm, moderate variability, + accels, no decels TOCO: q2-61min, mild to mod by palp. PItocin at 11mu/min SVE:  Dilation: 4.5 / Effacement (%): 70 / Station: -3    Cephalic by leopolds and exam   Assessment:  Brittany Ferguson is a 34 y.o. G59P2003 female at [redacted]w[redacted]d with IOL for severe IUGR.   Plan:  1. Admit to Labor & Delivery;  consents reviewed and obtained - COVID negative.   2. Fetal Well being  - Fetal Tracing: Cat II tracing intermittently with late and variable decels.  - Group B Streptococcus ppx indicated: negative - Presentation: cephalic confirmed by exam and Leopolds, [place].    3. Routine OB: - Prenatal labs reviewed, as above - Rh A Pos. - CBC, T&S, RPR on admit - Clear fluids, IVF  4. Induction of Labor -  Contractions: external toco in place -  Pelvis proven to 7#14 -  Plan for induction with Pitocin, AROM -  Plan for continuous fetal monitoring  -  Maternal pain control as desired - Anticipate vaginal delivery  5. Post Partum Planning: - Infant feeding: breast - Contraception: TBD - Tdap: 11/06/19, flu declined.   Randa Ngo, CNM 11/16/19 9:36 AM

## 2019-11-16 NOTE — Anesthesia Procedure Notes (Signed)
Epidural Patient location during procedure: OB Start time: 11/16/2019 10:52 AM End time: 11/16/2019 10:55 AM  Staffing Anesthesiologist: Corinda Gubler, MD Resident/CRNA: Karoline Caldwell, CRNA Performed: resident/CRNA   Preanesthetic Checklist Completed: patient identified, IV checked, site marked, risks and benefits discussed, surgical consent, monitors and equipment checked, pre-op evaluation and timeout performed  Epidural Patient position: sitting Prep: ChloraPrep Patient monitoring: heart rate, continuous pulse ox and blood pressure Approach: midline Location: L3-L4 Injection technique: LOR saline  Needle:  Needle type: Tuohy  Needle gauge: 17 G Needle length: 9 cm and 9 Needle insertion depth: 5 cm Catheter type: closed end flexible Catheter size: 19 Gauge Catheter at skin depth: 11 cm Test dose: negative and 1.5% lidocaine with Epi 1:200 K  Assessment Sensory level: T10 Events: blood not aspirated, injection not painful, no injection resistance, no paresthesia and negative IV test  Additional Notes 1 attempt Pt. Evaluated and documentation done after procedure finished. Patient identified. Risks/Benefits/Options discussed with patient including but not limited to bleeding, infection, nerve damage, paralysis, failed block, incomplete pain control, headache, blood pressure changes, nausea, vomiting, reactions to medication both or allergic, itching and postpartum back pain. Confirmed with bedside nurse the patient's most recent platelet count. Confirmed with patient that they are not currently taking any anticoagulation, have any bleeding history or any family history of bleeding disorders. Patient expressed understanding and wished to proceed. All questions were answered. Sterile technique was used throughout the entire procedure. Please see nursing notes for vital signs. Test dose was given through epidural catheter and negative prior to continuing to dose epidural or start  infusion. Warning signs of high block given to the patient including shortness of breath, tingling/numbness in hands, complete motor block, or any concerning symptoms with instructions to call for help. Patient was given instructions on fall risk and not to get out of bed. All questions and concerns addressed with instructions to call with any issues or inadequate analgesia.   Patient tolerated the insertion well without immediate complications.Reason for block:procedure for pain

## 2019-11-17 ENCOUNTER — Encounter: Payer: Self-pay | Admitting: Obstetrics and Gynecology

## 2019-11-17 LAB — CBC
HCT: 30.4 % — ABNORMAL LOW (ref 36.0–46.0)
Hemoglobin: 10 g/dL — ABNORMAL LOW (ref 12.0–15.0)
MCH: 28.5 pg (ref 26.0–34.0)
MCHC: 32.9 g/dL (ref 30.0–36.0)
MCV: 86.6 fL (ref 80.0–100.0)
Platelets: 291 10*3/uL (ref 150–400)
RBC: 3.51 MIL/uL — ABNORMAL LOW (ref 3.87–5.11)
RDW: 13.2 % (ref 11.5–15.5)
WBC: 14.6 10*3/uL — ABNORMAL HIGH (ref 4.0–10.5)
nRBC: 0 % (ref 0.0–0.2)

## 2019-11-17 MED ORDER — WITCH HAZEL-GLYCERIN EX PADS: 1.0000 "application " | MEDICATED_PAD | CUTANEOUS | Status: DC | PRN

## 2019-11-17 MED ORDER — DIPHENHYDRAMINE HCL 25 MG PO CAPS: 25.0000 mg | ORAL_CAPSULE | Freq: Four times a day (QID) | ORAL | Status: DC | PRN

## 2019-11-17 MED ORDER — ONDANSETRON HCL 4 MG/2ML IJ SOLN: 4.0000 mg | INTRAMUSCULAR | Status: DC | PRN

## 2019-11-17 MED ORDER — ACETAMINOPHEN 325 MG PO TABS
650.0000 mg | ORAL_TABLET | ORAL | Status: DC | PRN
  Administered 2019-11-17: 650 mg via ORAL
  Filled 2019-11-17: qty 2

## 2019-11-17 MED ORDER — IBUPROFEN 600 MG PO TABS
600.0000 mg | ORAL_TABLET | Freq: Four times a day (QID) | ORAL | Status: DC
  Administered 2019-11-17 – 2019-11-19 (×9): 600 mg via ORAL
  Filled 2019-11-17 (×11): qty 1

## 2019-11-17 MED ORDER — MISOPROSTOL 200 MCG PO TABS
800.0000 ug | ORAL_TABLET | Freq: Once | ORAL | Status: AC
  Administered 2019-11-17: 800 ug via RECTAL

## 2019-11-17 MED ORDER — SIMETHICONE 80 MG PO CHEW: 80.0000 mg | CHEWABLE_TABLET | ORAL | Status: DC | PRN

## 2019-11-17 MED ORDER — ZOLPIDEM TARTRATE 5 MG PO TABS: 5.0000 mg | ORAL_TABLET | Freq: Every evening | ORAL | Status: DC | PRN

## 2019-11-17 MED ORDER — COCONUT OIL OIL
1.0000 "application " | TOPICAL_OIL | Status: DC | PRN
  Administered 2019-11-17: 1 via TOPICAL
  Filled 2019-11-17: qty 120

## 2019-11-17 MED ORDER — IBUPROFEN 600 MG PO TABS
ORAL_TABLET | ORAL | Status: AC
  Filled 2019-11-17: qty 1

## 2019-11-17 MED ORDER — PRENATAL MULTIVITAMIN CH
1.0000 | ORAL_TABLET | Freq: Every day | ORAL | Status: DC
  Administered 2019-11-18 – 2019-11-19 (×2): 1 via ORAL
  Filled 2019-11-17 (×2): qty 1

## 2019-11-17 MED ORDER — DIBUCAINE (PERIANAL) 1 % EX OINT: 1.0000 "application " | TOPICAL_OINTMENT | CUTANEOUS | Status: DC | PRN

## 2019-11-17 MED ORDER — SENNOSIDES-DOCUSATE SODIUM 8.6-50 MG PO TABS
2.0000 | ORAL_TABLET | ORAL | Status: DC
  Administered 2019-11-18 – 2019-11-19 (×2): 2 via ORAL
  Filled 2019-11-17 (×2): qty 2

## 2019-11-17 MED ORDER — ONDANSETRON HCL 4 MG PO TABS: 4.0000 mg | ORAL_TABLET | ORAL | Status: DC | PRN

## 2019-11-17 MED ORDER — FERROUS SULFATE 325 (65 FE) MG PO TABS
325.0000 mg | ORAL_TABLET | Freq: Two times a day (BID) | ORAL | Status: DC
  Administered 2019-11-18 – 2019-11-19 (×3): 325 mg via ORAL
  Filled 2019-11-17 (×6): qty 1

## 2019-11-17 MED ORDER — BENZOCAINE-MENTHOL 20-0.5 % EX AERO: 1.0000 "application " | INHALATION_SPRAY | CUTANEOUS | Status: DC | PRN

## 2019-11-17 NOTE — Discharge Summary (Signed)
Obstetrical Discharge Summary  Patient Name: Brittany Ferguson DOB: 06-Apr-1985 MRN: [id number]  Date of Admission: 11/16/2019 Date of Delivery: 11/17/19 Delivered by: Heloise Ochoa CNM Date of Discharge: 11/19/2019  Primary OB: [facility]  IHK:[id number]'L last menstrual period was 03/01/2019. EDC Estimated Date of Delivery: 12/06/19 Gestational Age at Delivery: [redacted]w[redacted]d   Antepartum complications:  1. Asymmetric fetal growth restriction 2. Iron deficiency Anemia, on iron supplement  3. History of postpartum depression and anxiety 4. Subchorionic hematoma - resolved  5. Oldest daughter dx with suspected neurofibromatosis Type 1  Admitting Diagnosis: IUGR, 37wks Secondary Diagnosis: SVD, nuchal cord x 3, body cord x 2  Patient Active Problem List   Diagnosis Date Noted   NSVD (normal spontaneous vaginal delivery) 11/19/2019   Intrauterine growth restriction (IUGR) affecting care of mother, third trimester, single gestation 11/16/2019   Depression 07/23/2011   History of migraine headaches 07/23/2011    Augmentation: AROM, Pitocin and OP Foley Complications: None Intrapartum complications/course: see delivery note Date of Delivery: 11/17/19 Delivered By: Heloise Ochoa CNM Delivery Type: spontaneous vaginal delivery Anesthesia: epidural Placenta: spontaneous, with accessory lobe, sent to pathology for eval.  Laceration: none Episiotomy: none Newborn Data: Live born female "Everlie" Birth Weight: 4 lb 3 oz (1900 g) APGAR: 8, 8  Newborn Delivery   Birth date/time: 11/17/2019 04:41:00 Delivery type: Vaginal, Spontaneous      Postpartum Procedures: started on Zoloft on PPD1.  EdinburghInocente Salles Postnatal Depression Scale Screening Tool 11/17/2019  I have been able to laugh and see the funny side of things. 0  I have looked forward with enjoyment to things. 0  I have blamed myself unnecessarily when things went wrong. 2  I have been anxious or worried for no  good reason. 2  I have felt scared or panicky for no good reason. 2  Things have been getting on top of me. 2  I have been so unhappy that I have had difficulty sleeping. 1  I have felt sad or miserable. 1  I have been so unhappy that I have been crying. 1  The thought of harming myself has occurred to me. 0  Edinburgh Postnatal Depression Scale Total 11      Post partum course:  Patient had an uncomplicated postpartum course.  By time of discharge on PPD#2, her pain was controlled on oral pain medications; she had appropriate lochia and was ambulating, voiding without difficulty and tolerating regular diet.  She was deemed stable for discharge to home.    Discharge Physical Exam:  BP 109/70 (BP Location: Right Arm)    Pulse 64    Temp 97.7 F (36.5 C) (Oral)    Resp 18    Ht 5\' 7"  (1.702 m)    Wt 82.6 kg    LMP 03/01/2019    SpO2 100%    Breastfeeding Unknown    BMI 28.51 kg/m   General: NAD CV: RRR Pulm: CTABL, nl effort ABD: s/nd/nt, fundus firm and below the umbilicus Lochia: moderate Perineum: intact DVT Evaluation: LE non-ttp, no evidence of DVT on exam.  Hemoglobin  Date Value Ref Range Status  11/18/2019 9.8 (L) 12.0 - 15.0 g/dL Final  13/07/2019 87/56/4332 g/dL Final   HCT  Date Value Ref Range Status  11/18/2019 29.4 (L) 36 - 46 % Final  01/30/2014 36 % Final     Disposition: stable, discharge to home. Baby Feeding: breastmilk Baby Disposition: home with mom  Rh Immune globulin given: n/a Rubella vaccine given: immune Varicella  vaccine given: immune Tdap vaccine given in AP or PP setting: 11/06/19 Flu vaccine given in AP or PP setting: declined  Contraception: TBD  Prenatal Labs:  Blood type/Rh A pos  Antibody screen neg  Rubella Immune  Varicella Immune  RPR NR  HBsAg Neg  HIV NR  GC neg  Chlamydia neg  Genetic screening negative  1 hour GTT  131  3 hour GTT  n/a  GBS  negative      Plan:  Nomie Buchberger was discharged to home in good  condition. Follow-up appointment with delivering provider in 2 weeks for mood check  Discharge Medications: Allergies as of 11/19/2019      Reactions   Penicillins Hives, Rash   As a child      Medication List    TAKE these medications   acetaminophen 325 MG tablet Commonly known as: Tylenol Take 2 tablets (650 mg total) by mouth every 4 (four) hours as needed (for pain scale < 4).   benzocaine-Menthol 20-0.5 % Aero Commonly known as: DERMOPLAST Apply 1 application topically as needed for irritation (perineal discomfort).   coconut oil Oil Apply 1 application topically as needed.   ferrous sulfate 325 (65 FE) MG tablet Take 1 tablet (325 mg total) by mouth 2 (two) times daily with a meal.   ibuprofen 600 MG tablet Commonly known as: ADVIL Take 1 tablet (600 mg total) by mouth every 6 (six) hours.   Prenatal 28-0.8 MG Tabs Take 1 tablet by mouth every morning.   senna-docusate 8.6-50 MG tablet Commonly known as: Senokot-S Take 2 tablets by mouth daily.   sertraline 25 MG tablet Commonly known as: ZOLOFT Take 1 tablet (25 mg total) by mouth daily.   witch hazel-glycerin pad Commonly known as: TUCKS Apply 1 application topically as needed for hemorrhoids.        Follow-up Information    McVey, Prudencio Pair, CNM Follow up in 2 week(s).   Specialty: Obstetrics and Gynecology Why: postpartum visit at 6wks, mood check at [facility] information: [address] [place] [address] [phone]               Signed:  Cyril Mourning, CNM 11/19/2019  7:36 AM

## 2019-11-17 NOTE — Progress Notes (Signed)
Labor Progress Note  Brittany Ferguson is a 34 y.o. G4P2003 at [redacted]w[redacted]d by ultrasound admitted for induction of labor due to Poor fetal growth.  Subjective: comfortable. Tracing reviewed, pitocin off again due to variable, prolonged decels.   Objective: BP (!) 95/47   Pulse 61   Temp 98.2 F (36.8 C) (Oral)   Resp 17   Ht 5\' 7"  (1.702 m)   Wt 82.6 kg   LMP 03/01/2019   SpO2 100%   BMI 28.51 kg/m  Notable VS details: reviewed.   Fetal Assessment: FHT:  FHR: 125 bpm, variability: moderate,  accelerations:  Present,  decelerations:  Present variable and prlonged decels  - Pitocin dc by nursing.  Category/reactivity:  Category II UC:  Occasional UC q10-7min SVE:  9/80/0, moderate bloody show noted.  Membrane status: AROM performed at 1012 11/16/19 Amniotic color:  clear  Labs: Lab Results  Component Value Date   WBC 10.4 11/16/2019   HGB 9.9 (L) 11/16/2019   HCT 30.4 (L) 11/16/2019   MCV 85.9 11/16/2019   PLT 295 11/16/2019    Assessment / Plan: IOL due to severe IUGR  Labor: s/p Foley balloon, AROM and pitocin. Pitocin off due to decels, will restart after amnioinfusion bolus complete.  Preeclampsia:  no e/o pre-e Fetal Wellbeing:  Category II- will amnioinfuse and restart Pitocin. Pain Control:  Epidural I/D:  n/a Anticipated MOD:  NSVD  13/05/2019 [name], CNM 11/17/2019, 2:56 AM

## 2019-11-18 LAB — CBC
HCT: 29.4 % — ABNORMAL LOW (ref 36.0–46.0)
Hemoglobin: 9.8 g/dL — ABNORMAL LOW (ref 12.0–15.0)
MCH: 28.8 pg (ref 26.0–34.0)
MCHC: 33.3 g/dL (ref 30.0–36.0)
MCV: 86.5 fL (ref 80.0–100.0)
Platelets: 272 10*3/uL (ref 150–400)
RBC: 3.4 MIL/uL — ABNORMAL LOW (ref 3.87–5.11)
RDW: 13.2 % (ref 11.5–15.5)
WBC: 8.4 10*3/uL (ref 4.0–10.5)
nRBC: 0 % (ref 0.0–0.2)

## 2019-11-18 MED ORDER — FERROUS SULFATE 325 (65 FE) MG PO TABS: 325.0000 mg | ORAL_TABLET | Freq: Two times a day (BID) | ORAL | 0 refills | Status: DC

## 2019-11-18 MED ORDER — SERTRALINE HCL 25 MG PO TABS
25.0000 mg | ORAL_TABLET | Freq: Every day | ORAL | Status: DC
  Administered 2019-11-18 – 2019-11-19 (×2): 25 mg via ORAL
  Filled 2019-11-18 (×2): qty 1

## 2019-11-18 MED ORDER — WITCH HAZEL-GLYCERIN EX PADS: 1.0000 "application " | MEDICATED_PAD | CUTANEOUS | 12 refills | Status: AC | PRN

## 2019-11-18 MED ORDER — SENNOSIDES-DOCUSATE SODIUM 8.6-50 MG PO TABS: 2.0000 | ORAL_TABLET | ORAL | 0 refills | Status: DC

## 2019-11-18 MED ORDER — COCONUT OIL OIL: 1.0000 "application " | TOPICAL_OIL | 0 refills | Status: AC | PRN

## 2019-11-18 MED ORDER — ACETAMINOPHEN 325 MG PO TABS: 650.0000 mg | ORAL_TABLET | ORAL | Status: AC | PRN

## 2019-11-18 MED ORDER — SERTRALINE HCL 25 MG PO TABS: 25.0000 mg | ORAL_TABLET | Freq: Every day | ORAL | 0 refills | Status: DC

## 2019-11-18 MED ORDER — IBUPROFEN 600 MG PO TABS: 600.0000 mg | ORAL_TABLET | Freq: Four times a day (QID) | ORAL | 0 refills | Status: AC

## 2019-11-18 MED ORDER — BENZOCAINE-MENTHOL 20-0.5 % EX AERO: 1.0000 "application " | INHALATION_SPRAY | CUTANEOUS | Status: DC | PRN

## 2019-11-18 NOTE — Anesthesia Postprocedure Evaluation (Signed)
Anesthesia Post Note  Patient: Brittany Ferguson  Procedure(s) Performed: AN AD HOC LABOR EPIDURAL  Patient location during evaluation: Mother Baby Anesthesia Type: Epidural Level of consciousness: awake and alert Pain management: pain level controlled Vital Signs Assessment: post-procedure vital signs reviewed and stable Respiratory status: spontaneous breathing, nonlabored ventilation and respiratory function stable Cardiovascular status: stable Postop Assessment: no headache, no backache and epidural receding Anesthetic complications: no   No complications documented.   Last Vitals:  Vitals:   11/17/19 2335 11/18/19 0735  BP: 110/62 (!) 94/59  Pulse: 65 (!) 54  Resp: 18 18  Temp: 36.6 C 36.8 C  SpO2: 98% 97%    Last Pain:  Vitals:   11/18/19 0735  TempSrc: Oral  PainSc:                  Corinda Gubler

## 2019-11-18 NOTE — Clinical Social Work Note (Signed)
Clinical Social Work Assessment  Patient Details  Name: Brittany Ferguson MRN: [id number] Date of Birth: 01-Nov-1985  Date of referral:  11/18/19               Reason for consult:  Mental Health Concerns (positive Edinburgh score of 11)                Permission sought to share information with:  Case Manager Permission granted to share information::  Yes, Verbal Permission Granted  Name::     Brittany Ferguson, spouse, [phone]  Agency::     Relationship::     Contact Information:     Housing/Transportation Living arrangements for the past 2 months:  Single Family Home Source of Information:  Patient, Spouse Patient Interpreter Needed:  None Criminal Activity/Legal Involvement Pertinent to Current Situation/Hospitalization:  No - Comment as needed Significant Relationships:  Spouse Higher education careers adviser) Lives with:  Spouse, Minor Children Do you feel safe going back to the place where you live?  Yes Need for family participation in patient care:  No (Coment)  Care giving concerns:  Patient with history of postpartum depression. Agreeable to starting medication.    Social Worker assessment / plan:  The patient is a 34 year old female who gave birth to Baby girl Brittany Ferguson on 11/17/2019.  Patient reported that she is the mother of 4 children.  Stated that "I had postpartum depression with past deliveries."  Noted that she feels depress but her husband, Brittany Ferguson is a good support. Home is prepared for her newborn and she has state approved car seat. Reviewed with patient signs and symptoms of postpartum.    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   Patient/Family's Understanding of and Emotional Response to Diagnosis, Current Treatment, and  Prognosis:  Patient responded positively and asked appropriate questions.   Emotional Assessment Appearance:  Appears older than stated age Attitude/Demeanor/Rapport:  Engaged, Gracious Affect (typically observed):    Orientation:  Oriented to Self, Oriented to Place, Oriented to  Time, Oriented to Situation Alcohol / Substance use:  Other (Denied current use) Psych involvement (Current and /or in the community):  [facility])  Discharge Needs  Concerns to be addressed:  No discharge needs identified Readmission within the last 30 days:  No Current discharge risk:  None Barriers to Discharge:  No Barriers Identified   Mozes Sagar I Kashina Mecum, LCSW 11/18/2019, 2:52 PM

## 2019-11-18 NOTE — Progress Notes (Signed)
Post Partum Day 1 Subjective: Doing well, no complaints.  Tolerating regular diet, pain with PO meds, voiding and ambulating without difficulty. - anxiety about baby feeding and going home.  - Prior hx PPD, previously on Welbutrin.   No CP SOB Fever,Chills, N/V or leg pain; denies nipple or breast pain, no HA change of vision, RUQ/epigastric pain  Objective: BP (!) 94/59 (BP Location: Right Arm)   Pulse (!) 54   Temp 98.2 F (36.8 C) (Oral)   Resp 18   Ht 5\' 7"  (1.702 m)   Wt 82.6 kg   LMP 03/01/2019   SpO2 97%   Breastfeeding Unknown   BMI 28.51 kg/m    Physical Exam:  General: NAD Breasts: soft/nontender CV: RRR Pulm: nl effort, CTABL Abdomen: soft, NT, BS x 4 Perineum: minimal edema, intact Lochia: small Uterine Fundus: fundus firm and 2 fb below umbilicus DVT Evaluation: no cords, ttp LEs   Recent Labs    11/17/19 0727 11/18/19 0617  HGB 10.0* 9.8*  HCT 30.4* 29.4*  WBC 14.6* 8.4  PLT 291 272    Assessment/Plan: 34 y.o. 34 postpartum day # 1  - Continue routine PP care - Lactation consult prn, considering use of SNS.   - discussed prior hx PPD, and increased anxiety this pregnancy in general due to ongoing complications - Mild anemia; iron deficiency and blood loss at delivery - hemodynamically stable and asymptomatic; start po ferrous sulfate BID with stool softeners  - Immunization status:  all Imms up to date    Disposition: Does not desire Dc home today.     [id number], CNM 11/18/2019  11:26 AM

## 2019-11-19 NOTE — Discharge Instructions (Signed)

## 2019-11-19 NOTE — Progress Notes (Signed)
Discharge order received from doctor. Reviewed discharge instructions and prescriptions with patient and answered all questions. Follow up appointment instructions given. Patient verbalized understanding. ID bands checked. Patient discharged home with infant via wheelchair by nursing/auxillary.    Vanden Fawaz, RN  

## 2019-11-20 ENCOUNTER — Ambulatory Visit: Payer: Self-pay

## 2019-11-20 NOTE — Lactation Note (Signed)
This note was copied from a baby's chart. Lactation Consultation Note  Patient Name: Brittany Ferguson Start VQQVZ'[id number] Date: 11/20/2019 Reason for consult: Follow-up assessment;Early term 37-38.6wks;Infant < 6lbs;Other (Comment) (IUGR)  Lactation follow-up before discharge. Baby [name] is 77hrs old, born at 43wk w/ IUGR showed a weight gain overnight of 5g. Mom has been independently and successfully using a SNS with EBM and/or 22kcal Neosure during feedings. Bottles were given overnight after baby becoming frustrated at the breast per mom. LC encouraged offering of breast at each feeding, spending time skin to skin prior to feeding to encourage successful latch, self-rooting from [place], and gentle acceptance. Encouraged mom to continue pumping post feedings and as needed between feedings to help establish a plentiful supply and management of breast fullness and engorgement.  Reviewed early feeding cues, output expectations, growth spurts/cluster feeding, use of EBM prior to formula per Dr Rachel Bo, and follow-up care as needed. Information given for outpatient lactation services and community breastfeeding support groups reviewed with mom. Mom encouraged to call with questions/concerns and ongoing feeding support as needed.  Maternal Data Formula Feeding for Exclusion: No Has patient been taught Hand Expression?: Yes Does the patient have breastfeeding experience prior to this delivery?: Yes  Feeding    LATCH Score                   Interventions Interventions: Breast feeding basics reviewed;Hand express;DEBP  Lactation Tools Discussed/Used Tools: Pump Breast pump type: Double-Electric Breast Pump   Consult Status Consult Status: Complete Date: 11/20/19 Follow-up type: Call as needed    Danford Bad 11/20/2019, 9:51 AM

## 2019-11-26 LAB — SURGICAL PATHOLOGY

## 2019-12-06 ENCOUNTER — Inpatient Hospital Stay: Admit: 2019-12-06 | Payer: Self-pay

## 2020-10-15 IMAGING — US US MFM OB FOLLOW-UP
1 series · 13 of 13 positions shown · non-contrast
Comparison: none

PATIENT INFO:

PERFORMED BY:
SERVICE(S) PROVIDED:
 ----------------------------------------------------------------------
INDICATIONS:
  16 weeks gestation of pregnancy
  Vaginal bleeding
  F/up intrauterine cystic structure
FETAL EVALUATION:
 Num Of Fetuses:         1
 Fetal Heart Rate(bpm):  145
 Cardiac Activity:       Present
 Presentation:           Vertex
 Placenta:               Anterior
OB HISTORY:
 Gravidity:    4         Prem:   3
GESTATIONAL AGE:
 LMP:           16w 0d        Date:  03/01/19                 EDD:   12/06/19
 Best:          16w 0d     Det. By:  LMP  (03/01/19)          EDD:   12/06/19

[Series 1: us mfm ob follow-up · 13 of 13 slices shown]
[im 1/13]
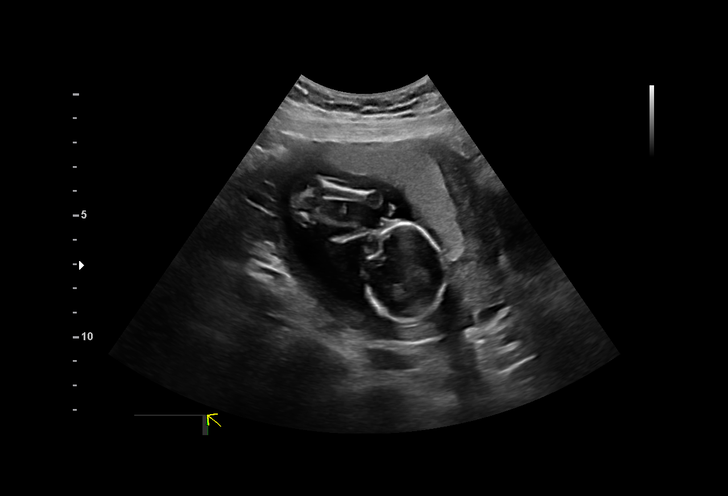
[im 2/13]
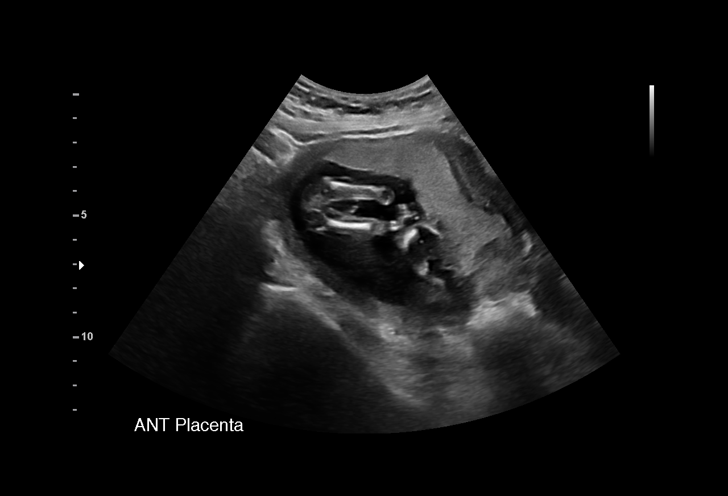
[im 3/13]
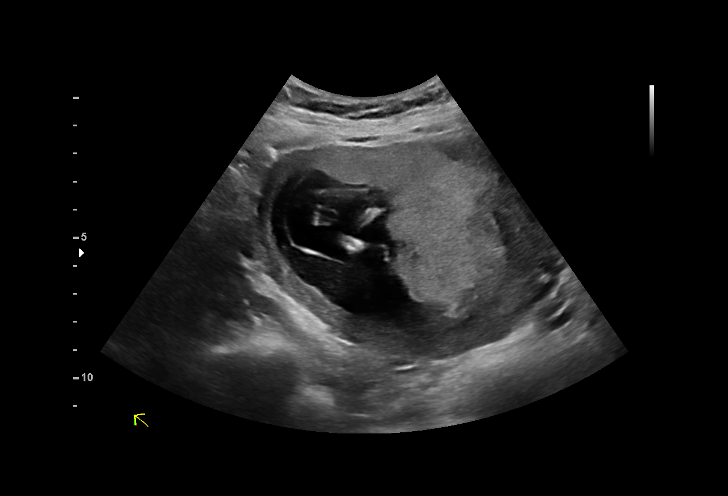
[im 4/13]
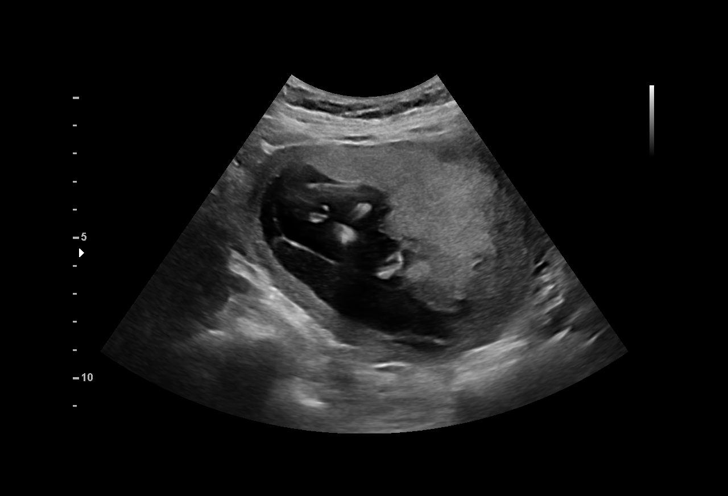
[im 5/13]
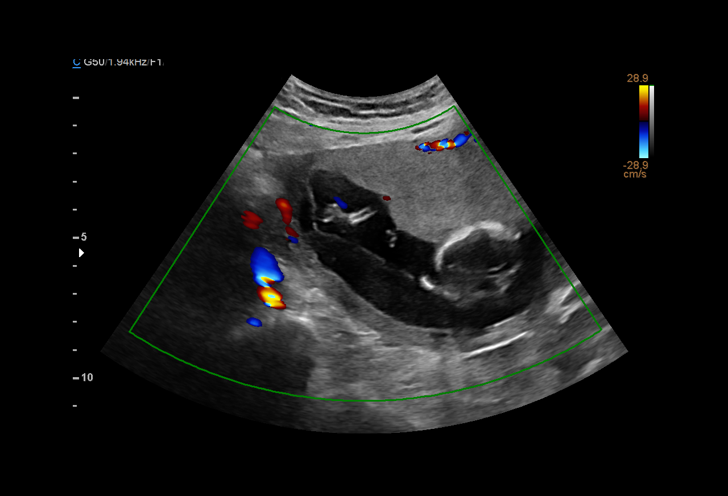
[im 6/13]
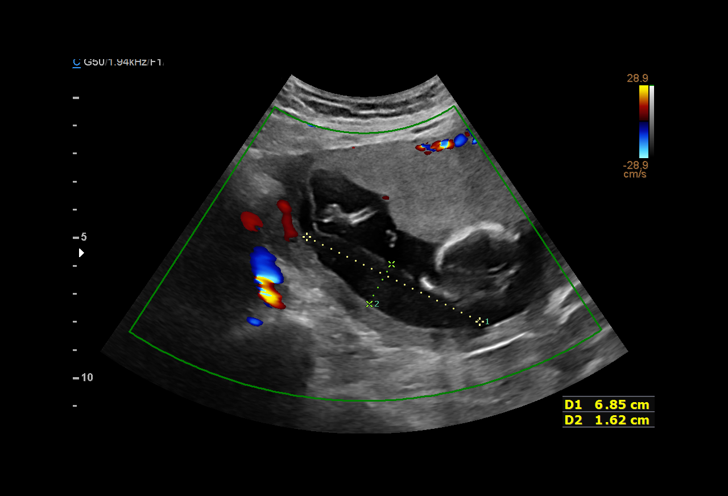
[im 7/13]
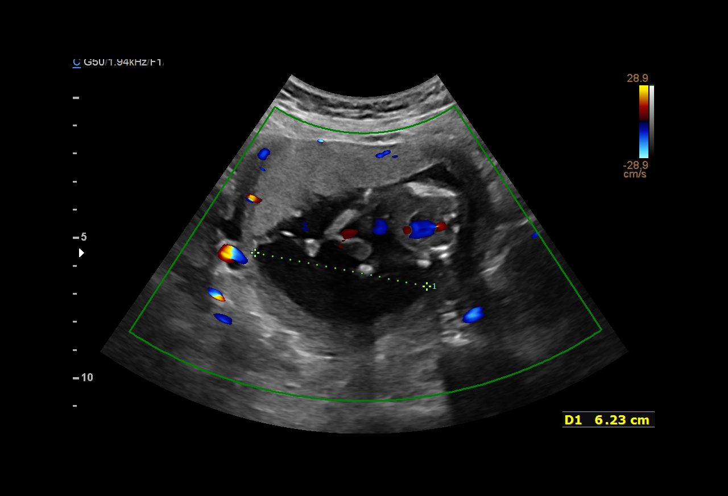
[im 8/13]
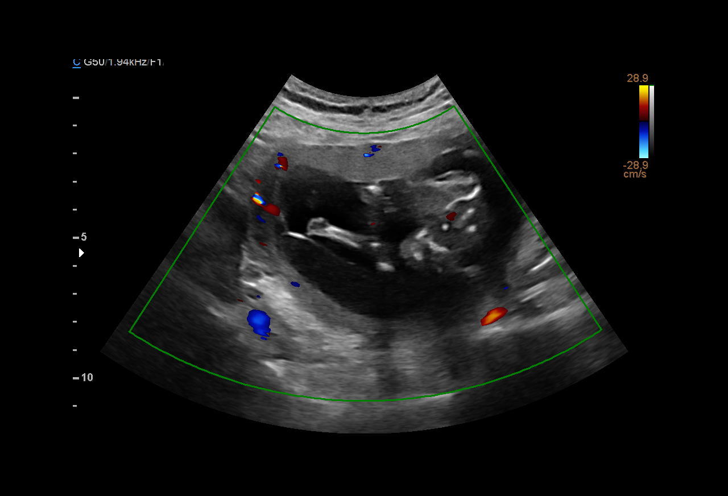
[im 9/13]
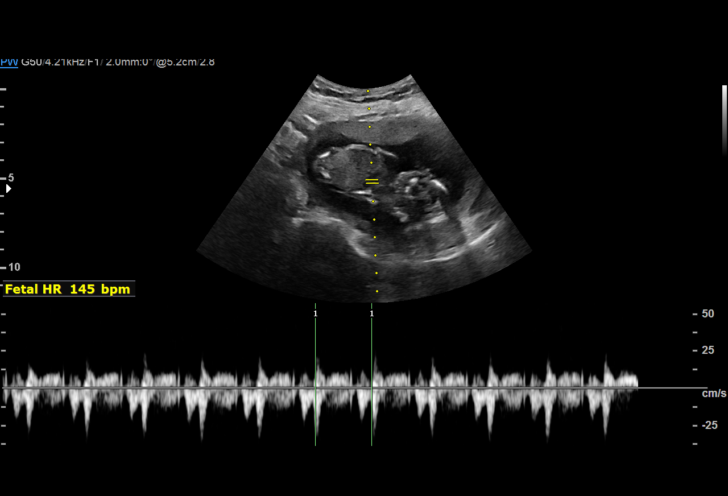
[im 10/13]
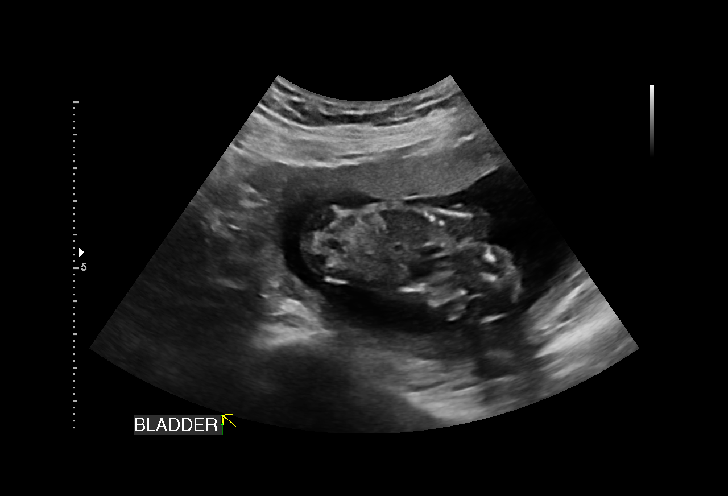
[im 11/13]
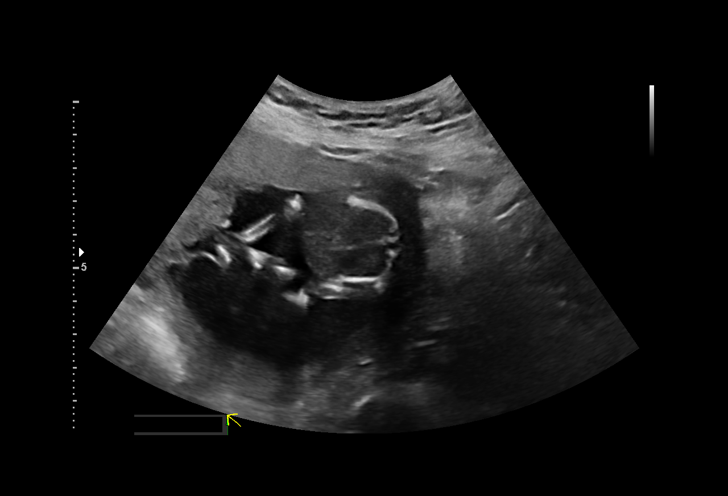
[im 12/13]
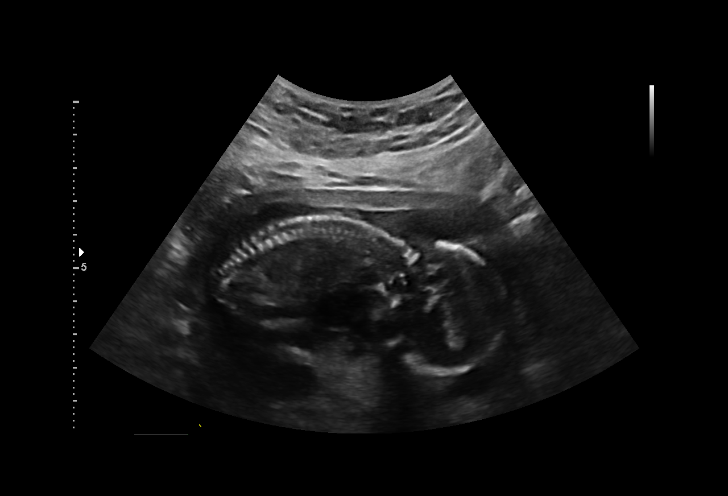
[im 13/13]
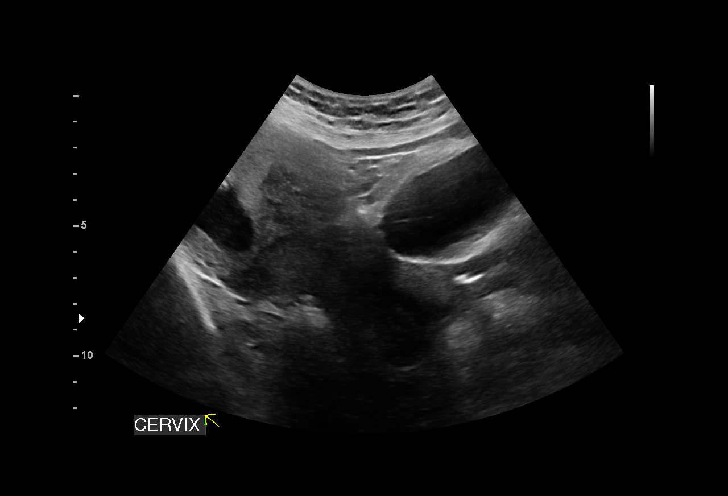

[13 of 13 positions shown; findings below may reference images not displayed]

IMPRESSION: Intrauterine pregnancy with a best estimated gestational age
 of 16 weeks 0 days.  Dating is based on LMP consistent with
 earliest available ultrasound, performed at [REDACTED] on
 04/18/19; measurements were reported as 7 weeks 2 days.

 Limited exam to follow up intrauterine cystic
 structure/subchorionic hematoma.

 Today this structure is seen tracking along the posterior wall
 of the uterus and appears most consistent with a moderate
 sized subchorionic hematoma (which was included in the
 differential diagnosis on prior ultrasound).  Measurement of
 the hematoma is 6.9 x 1.6 x 6.2 cm.  Ms. Markeisha reports
 continued vaginal bleeding but states that it is dark
 red/brown, consistent with old blood.

 Findings were discussed.  Reviewed that she will likely
 continue to have bleeding and that given the size of the
 hematoma, this will likely take several weeks to resolve.
 Recommend pelvic rest until cessation of bleeding for several
 days. She should contact her provider should her bleeding
 become bright red or should she experience abdominal
 cramping.  Maternal blood is Rh+.

 Ms. Imbro is scheduled for her fetal anatomy US in 2
 weeks.
                 Imbro

## 2021-01-18 IMAGING — US US MFM OB DETAIL+14 WK
1 series · 13 of 28 positions shown · non-contrast
Comparison: none

[Series 1: us mfm ob detail+14 wk · 13 of 90 slices shown]
[im 4/90]
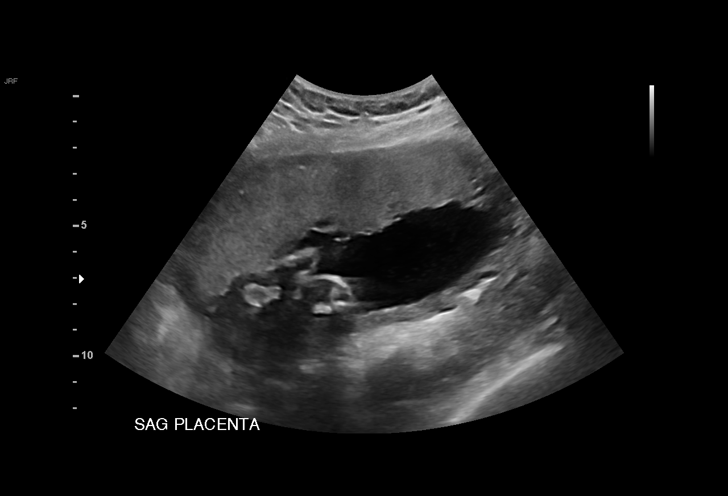
[im 10/90]
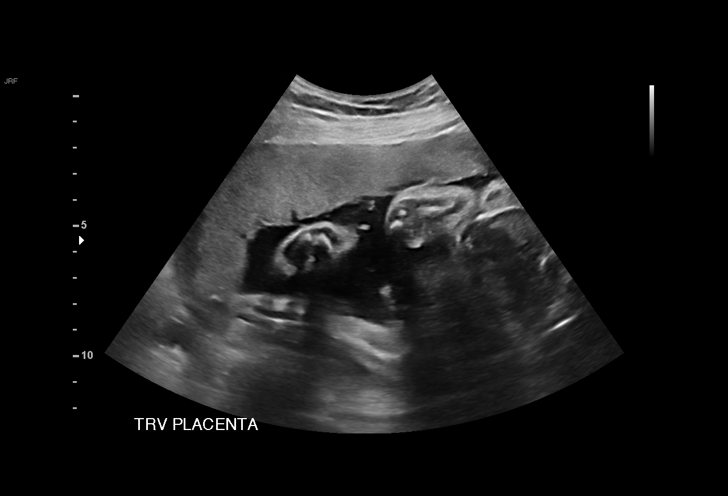
[im 17/90]
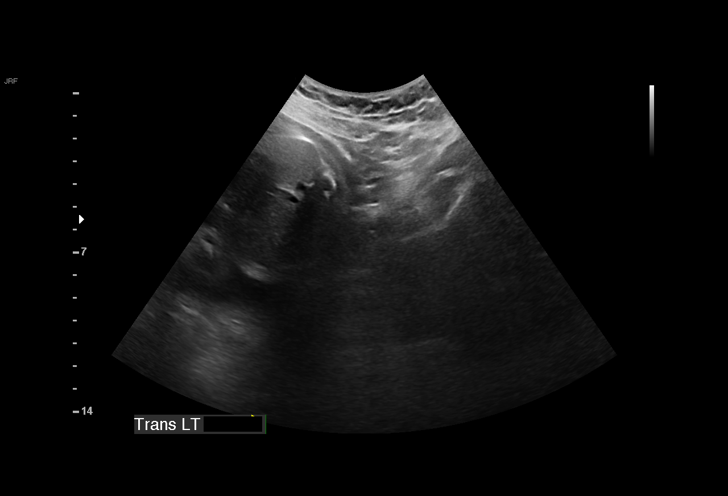
[im 24/90]
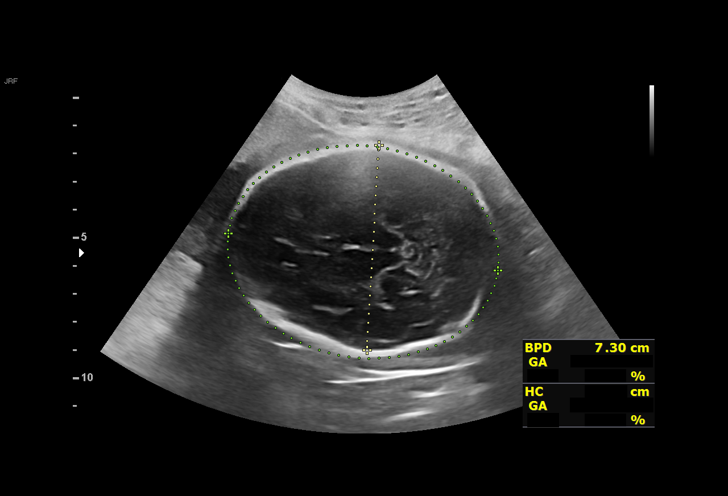
[im 30/90]
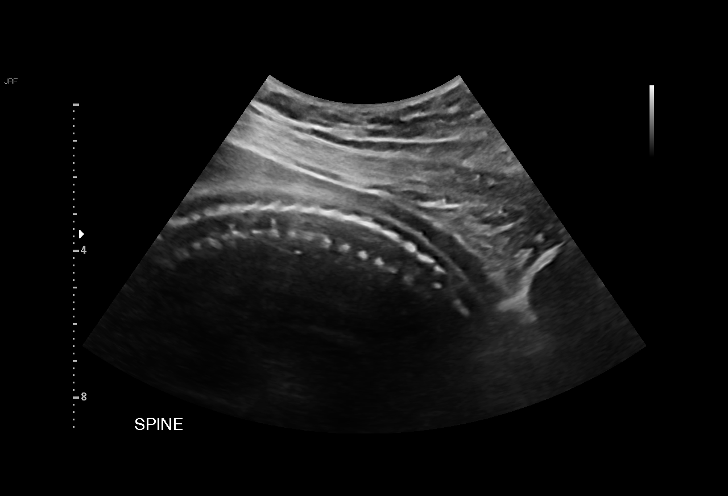
[im 37/90]
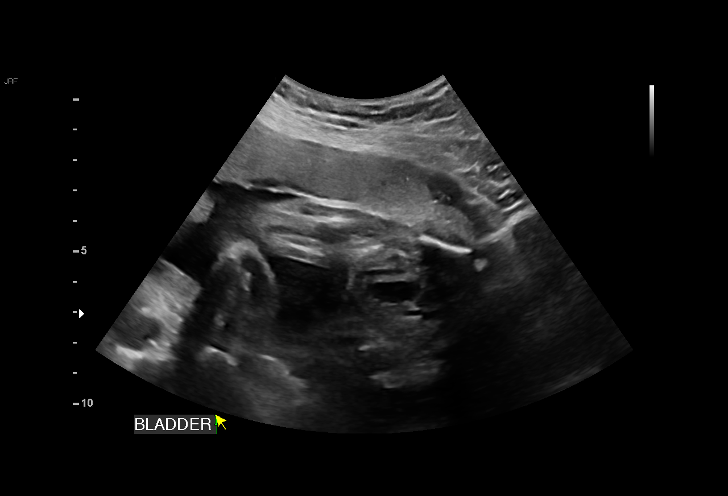
[im 47/90]
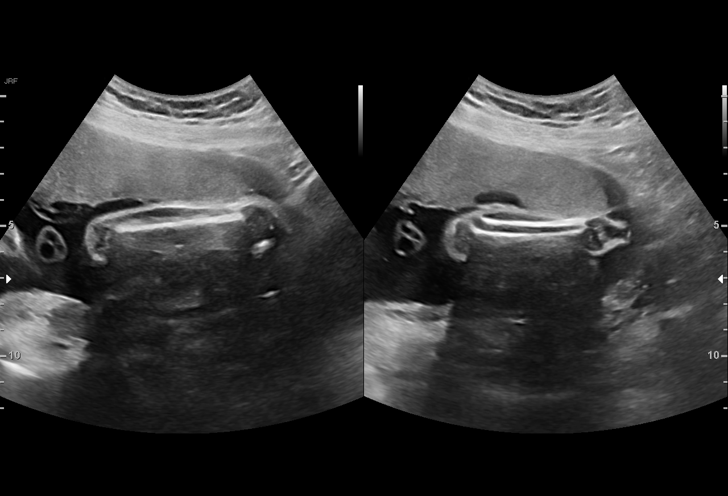
[im 53/90]
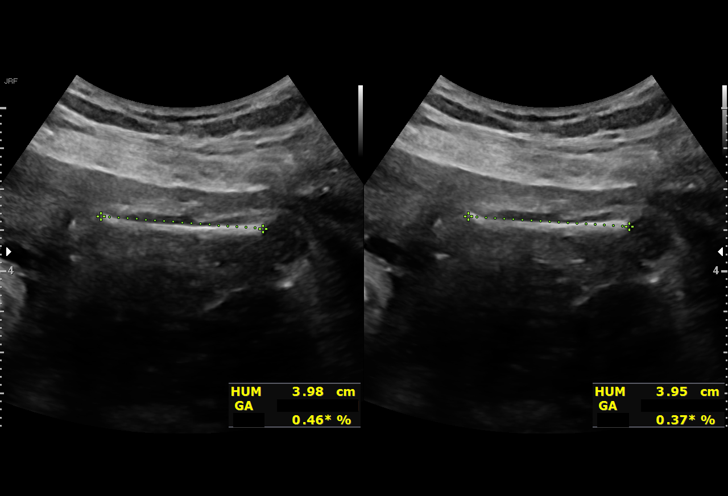
[im 60/90]
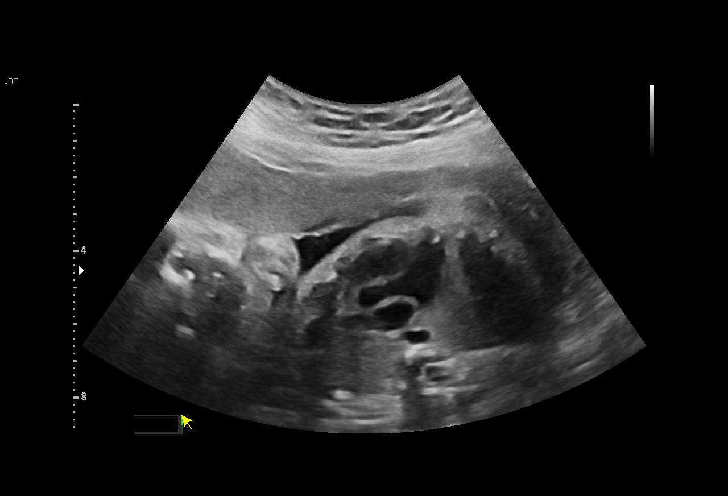
[im 66/90]
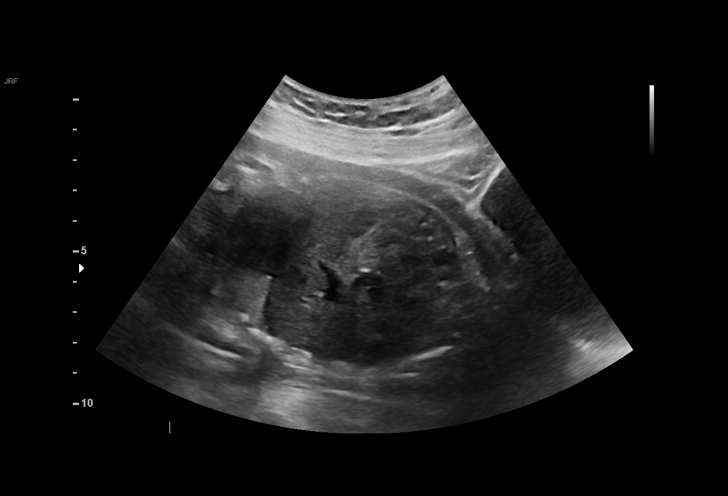
[im 73/90]
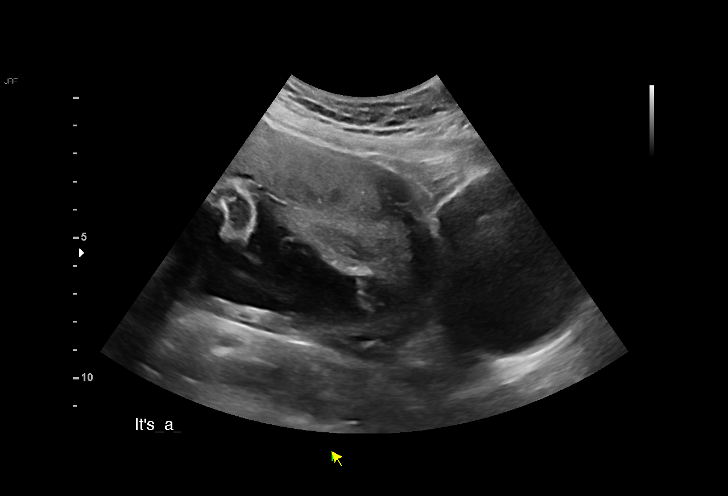
[im 80/90]
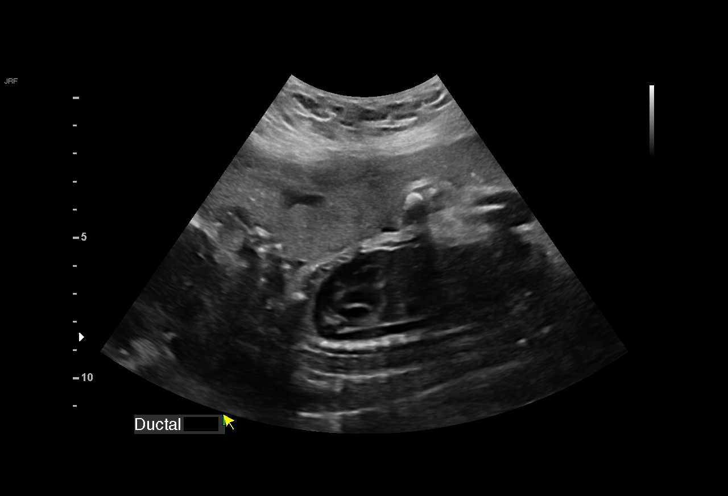
[im 86/90]
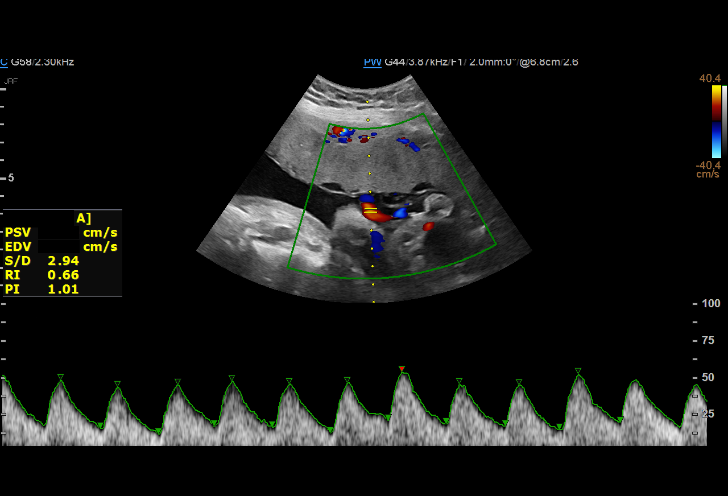

[13 of 28 positions shown; findings below may reference images not displayed]

at [REDACTED]

 2  US MFM UA CORD DOPPLER                76820.02    NDEGEYA IDRISSI

Indications

 29 weeks gestation of pregnancy
 Maternal care for known or suspected poor
 fetal growth, third trimester, fetus 1 IUGR
Fetal Evaluation

 Num Of Fetuses:          1
 Fetal Heart              124
 Rate(bpm):
 Cardiac Activity:        Observed
 Presentation:            Breech
 Placenta:                Anterior
 P. Cord Insertion:       Visualized

 AFI Sum(cm)     %Tile       Largest Pocket(cm)
 13.6            43

 RUQ(cm)       RLQ(cm)       LUQ(cm)        LLQ(cm)

Biophysical Evaluation

 Amniotic F.V:   Within normal limits       F. Tone:         Observed
 F. Movement:    Observed                   Score:           [DATE]
 F. Breathing:   Observed
Biometry

 BPD:      74.1  mm     G. Age:  29w 5d         42  %    CI:         74.64  %    70 - 86
                                                         FL/HC:       18.3  %    19.2 -
 HC:      272.2  mm     G. Age:  29w 5d         20  %    HC/AC:       1.26       0.99 -
 AC:      216.6  mm     G. Age:  26w 1d        < 1  %    FL/BPD:      67.2  %    71 - 87
 FL:       49.8  mm     G. Age:  26w 6d        < 1  %    FL/AC:       23.0  %    20 - 24
 HUM:      39.6  mm     G. Age:  24w 1d        < 5  %

 Est. FW:    4224   g      2 lb 3 oz    < 1  %
                    m
OB History

 Gravidity:    4         Prem:   3
Gestational Age

 LMP:           29w 4d        Date:  03/01/19                 EDD:    12/06/19
 U/S Today:     28w 1d                                        EDD:    12/16/19
 Best:          29w 4d     Det. By:  LMP  (03/01/19)          EDD:    12/06/19
Anatomy

 Cranium:               Appears normal         LVOT:                   Appears normal
 Cavum:                 Appears normal         Aortic Arch:            Appears normal
 Ventricles:            Appears normal         Ductal Arch:            Appears normal
 Choroid Plexus:        Appears normal         Diaphragm:              Appears normal
 Cerebellum:            Appears normal         Stomach:                Appears normal,
                                                                       left sided
 Posterior Fossa:       Appears normal         Abdomen:                Appears normal
 Nuchal Fold:           Appears normal         Abdominal Wall:         Appears nml (cord
                                                                       insert, abd wall)
 Face:                  Appears normal         Cord Vessels:           Appears normal (3
                        (orbits and profile)                           vessel cord)
 Lips:                  Appears normal         Kidneys:                Appear normal
 Palate:                Not well visualized    Bladder:                Appears normal
 Thoracic:              Appears normal         Spine:                  Appears normal
 Heart:                 Appears normal         Upper Extremities:      Appears normal
                        (4CH, axis, and
                        situs)
 RVOT:                  Appears normal         Lower Extremities:      Appears normal
Doppler - Fetal Vessels

 Umbilical Artery
   S/D    %tile      RI    %tile      PI    %tile            ADFV    RDFV
  3.76       88    0.73       86    1.27       93               No      No

Cervix Uterus Adnexa

 Cervix
 Length:           4.71  cm.
Impression

 Ms. Soraida is a G4P3 who is here for follow up growth due
 to IUGR seen on an outside exam at the request of Tarquino
 Zeinab, CNM

 She has a known history for subchorionic hematoma in
 06/21/19 that was 7 x 2 x 6 cm.  She had a recent visit with
 Dr. Chai with IUGR EFW 20% and AC <1 % normal UA
 Dopplers on 09/20/19.

 She has a low risk cell free DNA.

 Today normal anatomy with measurements less than dates
 due to IUGR EFW 1%.
 There is good fetal movement and amniotic fluid volume
 The UA Dopplers are normal.
 Subchorionic hematoma is not visualized today.

 I discussed today's visit with a diagnosis of IUGR. I
 explained that the etiology includes placental insufficiency,
 chronic disease, infection, aneuploidy and other genetic
 syndromes. She has a low risk NIPS She has no additional
 risk factors for chronic disease. At this time I explained the
 diagnosis, evaluation and management to include on going
 fetal growth and weekly antenatal testing to include UA
 Dopplers.  I recommend delivery at 37 weeks given EFW
 <1% and continued weekly testing.
Recommendations

 Continue weekly testing with UA Dopplers
 Repeat growth in 4 weeks.
 If these studies can be performed at your offices I have
 asked Ms. Soraida to cancel these studies.

## 2021-03-26 ENCOUNTER — Other Ambulatory Visit: Payer: Self-pay

## 2021-03-26 ENCOUNTER — Encounter: Payer: Self-pay | Admitting: Internal Medicine

## 2021-03-26 ENCOUNTER — Ambulatory Visit: Payer: BC Managed Care – PPO | Admitting: Internal Medicine

## 2021-03-26 VITALS — BP 112/72 | HR 62 | Temp 97.5°F | Ht 67.0 in | Wt 171.0 lb

## 2021-03-26 DIAGNOSIS — E559 Vitamin D deficiency, unspecified: Secondary | ICD-10-CM

## 2021-03-26 DIAGNOSIS — Z6826 Body mass index (BMI) 26.0-26.9, adult: Secondary | ICD-10-CM

## 2021-03-26 DIAGNOSIS — G43839 Menstrual migraine, intractable, without status migrainosus: Secondary | ICD-10-CM

## 2021-03-26 DIAGNOSIS — Z833 Family history of diabetes mellitus: Secondary | ICD-10-CM

## 2021-03-26 DIAGNOSIS — E663 Overweight: Secondary | ICD-10-CM

## 2021-03-26 DIAGNOSIS — F419 Anxiety disorder, unspecified: Secondary | ICD-10-CM | POA: Diagnosis not present

## 2021-03-26 DIAGNOSIS — E538 Deficiency of other specified B group vitamins: Secondary | ICD-10-CM

## 2021-03-26 DIAGNOSIS — F32A Depression, unspecified: Secondary | ICD-10-CM

## 2021-03-26 DIAGNOSIS — Z136 Encounter for screening for cardiovascular disorders: Secondary | ICD-10-CM

## 2021-03-26 MED ORDER — ESCITALOPRAM OXALATE 10 MG PO TABS: 10.0000 mg | ORAL_TABLET | Freq: Every day | ORAL | 2 refills | Status: DC

## 2021-03-26 NOTE — Assessment & Plan Note (Signed)
Hormonal ?She is not currently on any hormones at this time ?Continue Excedrin Migraine as needed ?

## 2021-03-26 NOTE — Patient Instructions (Signed)
Managing Anxiety, Adult ?After being diagnosed with anxiety, you may be relieved to know why you have felt or behaved a certain way. You may also feel overwhelmed about the treatment ahead and what it will mean for your life. With care and support, you can manage this condition. ?How to manage lifestyle changes ?Managing stress and anxiety ?Stress is your body's reaction to life changes and events, both good and bad. Most stress will last just a few hours, but stress can be ongoing and can lead to more than just stress. Although stress can play a major role in anxiety, it is not the same as anxiety. Stress is usually caused by something external, such as a deadline, test, or competition. Stress normally passes after the triggering event has ended.  ?Anxiety is caused by something internal, such as imagining a terrible outcome or worrying that something will go wrong that will devastate you. Anxiety often does not go away even after the triggering event is over, and it can become long-term (chronic) worry. It is important to understand the differences between stress and anxiety and to manage your stress effectively so that it does not lead to an anxious response. ?Talk with your health care provider or a counselor to learn more about reducing anxiety and stress. He or she may suggest tension reduction techniques, such as: ?Music therapy. Spend time creating or listening to music that you enjoy and that inspires you. ?Mindfulness-based meditation. Practice being aware of your normal breaths while not trying to control your breathing. It can be done while sitting or walking. ?Centering prayer. This involves focusing on a word, phrase, or sacred image that means something to you and brings you peace. ?Deep breathing. To do this, expand your stomach and inhale slowly through your nose. Hold your breath for 3-5 seconds. Then exhale slowly, letting your stomach muscles relax. ?Self-talk. Learn to notice and identify  thought patterns that lead to anxiety reactions and change those patterns to thoughts that feel peaceful. ?Muscle relaxation. Taking time to tense muscles and then relax them. ?Choose a tension reduction technique that fits your lifestyle and personality. These techniques take time and practice. Set aside 5-15 minutes a day to do them. Therapists can offer counseling and training in these techniques. The training to help with anxiety may be covered by some insurance plans. ?Other things you can do to manage stress and anxiety include: ?Keeping a stress diary. This can help you learn what triggers your reaction and then learn ways to manage your response. ?Thinking about how you react to certain situations. You may not be able to control everything, but you can control your response. ?Making time for activities that help you relax and not feeling guilty about spending your time in this way. ?Doing visual imagery. This involves imagining or creating mental pictures to help you relax. ?Practicing yoga. Through yoga poses, you can lower tension and promote relaxation. ? ?Medicines ?Medicines can help ease symptoms. Medicines for anxiety include: ?Antidepressant medicines. These are usually prescribed for long-term daily control. ?Anti-anxiety medicines. These may be added in severe cases, especially when panic attacks occur. ?Medicines will be prescribed by a health care provider. When used together, medicines, psychotherapy, and tension reduction techniques may be the most effective treatment. ?Relationships ?Relationships can play a big part in helping you recover. Try to spend more time connecting with trusted friends and family members. ?Consider going to couples counseling if you have a partner, taking family education classes, or going to family   therapy. ?Therapy can help you and others better understand your condition. ?How to recognize changes in your anxiety ?Everyone responds differently to treatment for  anxiety. Recovery from anxiety happens when symptoms decrease and stop interfering with your daily activities at home or work. This may mean that you will start to: ?Have better concentration and focus. Worry will interfere less in your daily thinking. ?Sleep better. ?Be less irritable. ?Have more energy. ?Have improved memory. ?It is also important to recognize when your condition is getting worse. Contact your health care provider if your symptoms interfere with home or work and you feel like your condition is not improving. ?Follow these instructions at home: ?Activity ?Exercise. Adults should do the following: ?Exercise for at least 150 minutes each week. The exercise should increase your heart rate and make you sweat (moderate-intensity exercise). ?Strengthening exercises at least twice a week. ?Get the right amount and quality of sleep. Most adults need 7-9 hours of sleep each night. ?Lifestyle ? ?Eat a healthy diet that includes plenty of vegetables, fruits, whole grains, low-fat dairy products, and lean protein. ?Do not eat a lot of foods that are high in fats, added sugars, or salt (sodium). ?Make choices that simplify your life. ?Do not use any products that contain nicotine or tobacco. These products include cigarettes, chewing tobacco, and vaping devices, such as e-cigarettes. If you need help quitting, ask your health care provider. ?Avoid caffeine, alcohol, and certain over-the-counter cold medicines. These may make you feel worse. Ask your pharmacist which medicines to avoid. ?General instructions ?Take over-the-counter and prescription medicines only as told by your health care provider. ?Keep all follow-up visits. This is important. ?Where to find support ?You can get help and support from these sources: ?Self-help groups. ?Online and community organizations. ?A trusted spiritual leader. ?Couples counseling. ?Family education classes. ?Family therapy. ?Where to find more information ?You may find  that joining a support group helps you deal with your anxiety. The following sources can help you locate counselors or support groups near you: ?Mental Health America: www.mentalhealthamerica.net ?Anxiety and Depression Association of America (ADAA): www.adaa.org ?National Alliance on Mental Illness (NAMI): www.nami.org ?Contact a health care provider if: ?You have a hard time staying focused or finishing daily tasks. ?You spend many hours a day feeling worried about everyday life. ?You become exhausted by worry. ?You start to have headaches or frequently feel tense. ?You develop chronic nausea or diarrhea. ?Get help right away if: ?You have a racing heart and shortness of breath. ?You have thoughts of hurting yourself or others. ?If you ever feel like you may hurt yourself or others, or have thoughts about taking your own life, get help right away. Go to your nearest emergency department or: ?Call your local emergency services (911 in the [place] ?Call a suicide crisis helpline, such as the National Suicide Prevention Lifeline at 1-800-273-8255 or 988 in the [place] This is open 24 hours a day in the [place] ?Text the Crisis Text Line at 741741 (in the [place] ?Summary ?Taking steps to learn and use tension reduction techniques can help calm you and help prevent triggering an anxiety reaction. ?When used together, medicines, psychotherapy, and tension reduction techniques may be the most effective treatment. ?Family, friends, and partners can play a big part in supporting you. ?This information is not intended to replace advice given to you by your health care provider. Make sure you discuss any questions you have with your health care provider. ?Document Revised: 07/23/2020 Document Reviewed: 04/20/2020 ?Elsevier Patient   Education ? 2022 [employer]. ? ?

## 2021-03-26 NOTE — Assessment & Plan Note (Addendum)
We will trial Escitalopram ?We will check CBC, TSH, vitamin D, B12, Estrogen and Progesterone today ?She will keep her appointment with psychiatry in June ?Support offered ?

## 2021-03-26 NOTE — Progress Notes (Signed)
HPI ? ?Patient presents to clinic today to establish care and for management of the conditions listed below. ? ?Migraines: Triggered by hormones, lack of headache. These occur 1 x month, before her menses. She takes Excedrin Migraine and lays in a quiet, dark room with good relief of symptoms.  ? ?Anxiety and Depression: Chronic, she is not currently taking Sertraline as prescribed due to side effects. She has tried Wellbutrin in the past. She is not currently seeing a therapist but has an appt with psychiatry in June. She denies SI/HI. ? ? ? ?Past Medical History:  ?Diagnosis Date  ? Medical history non-contributory   ? ? ?Current Outpatient Medications  ?Medication Sig Dispense Refill  ? acetaminophen (TYLENOL) 325 MG tablet Take 2 tablets (650 mg total) by mouth every 4 (four) hours as needed (for pain scale < 4).    ? benzocaine-Menthol (DERMOPLAST) 20-0.5 % AERO Apply 1 application topically as needed for irritation (perineal discomfort).    ? coconut oil OIL Apply 1 application topically as needed.  0  ? ferrous sulfate 325 (65 FE) MG tablet Take 1 tablet (325 mg total) by mouth 2 (two) times daily with a meal. 60 tablet 0  ? ibuprofen (ADVIL) 600 MG tablet Take 1 tablet (600 mg total) by mouth every 6 (six) hours. 90 tablet 0  ? PRENATAL 28-0.8 MG TABS Take 1 tablet by mouth every morning.    ? senna-docusate (SENOKOT-S) 8.6-50 MG tablet Take 2 tablets by mouth daily. 60 tablet 0  ? sertraline (ZOLOFT) 25 MG tablet Take 1 tablet (25 mg total) by mouth daily. 30 tablet 0  ? witch hazel-glycerin (TUCKS) pad Apply 1 application topically as needed for hemorrhoids. 40 each 12  ? ?No current facility-administered medications for this visit.  ? ?Facility-Administered Medications Ordered in Other Visits  ?Medication Dose Route Frequency Provider Last Rate Last Admin  ? misoprostol (CYTOTEC) tablet 25 mcg  25 mcg Vaginal Q4H PRN Gustavo Lah, CNM      ? ? ?Allergies  ?Allergen Reactions  ? Penicillins Hives and  Rash  ?  As a child  ? ? ?Family History  ?Problem Relation Age of Onset  ? Depression Mother   ? Heart disease Father   ? Hyperlipidemia Father   ? Hypertension Father   ? Heart disease Brother   ? Lung cancer Maternal Grandmother   ? Cancer Paternal Grandmother   ? Breast cancer Paternal Aunt   ? ? ?Social History  ? ?Socioeconomic History  ? Marital status: Married  ?  Spouse name: Elige Radon  ? Number of children: 1  ? Years of education: Not on file  ? Highest education level: Not on file  ?Occupational History  ? Occupation: Sales executive  ?  Employer: [employer]  ?Tobacco Use  ? Smoking status: Former  ?  Packs/day: 0.25  ?  Types: Cigarettes  ?  Quit date: 04/13/2016  ?  Years since quitting: 4.9  ? Smokeless tobacco: Never  ?Vaping Use  ? Vaping Use: Former  ? Quit date: 03/12/2019  ?Substance and Sexual Activity  ? Alcohol use: No  ? Drug use: No  ? Sexual activity: Yes  ?Other Topics Concern  ? Not on file  ?Social History Narrative  ? Not on file  ? ?Social Determinants of Health  ? ?Financial Resource Strain: Not on file  ?Food Insecurity: Not on file  ?Transportation Needs: Not on file  ?Physical Activity: Not on file  ?Stress: Not on  file  ?Social Connections: Not on file  ?Intimate Partner Violence: Not on file  ? ? ?ROS: ? ?Constitutional: Patient reports intermittent headaches.  Denies fever, malaise, fatigue, or abrupt weight changes.  ?HEENT: Denies eye pain, eye redness, ear pain, ringing in the ears, wax buildup, runny nose, nasal congestion, bloody nose, or sore throat. ?Respiratory: Denies difficulty breathing, shortness of breath, cough or sputum production.   ?Cardiovascular: Denies chest pain, chest tightness, palpitations or swelling in the hands or feet.  ?Gastrointestinal: Denies abdominal pain, bloating, constipation, diarrhea or blood in the stool.  ?GU: Denies frequency, urgency, pain with urination, blood in urine, odor or discharge. ?Musculoskeletal: Denies decrease in range  of motion, difficulty with gait, muscle pain or joint pain and swelling.  ?Skin: Denies redness, rashes, lesions or ulcercations.  ?Neurological: Denies dizziness, difficulty with memory, difficulty with speech or problems with balance and coordination.  ?Psych: Patient reports anxiety and depression.  Denies SI/HI. ? ?No other specific complaints in a complete review of systems (except as listed in HPI above). ? ?PE: ? ?BP 112/72 (BP Location: Right Arm, Patient Position: Sitting, Cuff Size: Large)   Pulse 62   Temp (!) 97.5 ?F (36.4 ?C) (Temporal)   Ht 5\' 7"  (1.702 m)   Wt 171 lb (77.6 kg)   SpO2 100%   BMI 26.78 kg/m?  ?Wt Readings from Last 3 Encounters:  ?03/26/21 171 lb (77.6 kg)  ?11/16/19 182 lb (82.6 kg)  ?09/24/19 179 lb (81.2 kg)  ? ? ?General: Appears her stated age, overweight, in NAD. ?HEENT: Head: normal shape and size; Eyes: sclera white and EOMs intact;  ?Cardiovascular: Normal rate . ?Pulmonary/Chest: Normal effort. ?Musculoskeletal: No difficulty with gait.  ?Neurological: Alert and oriented.  ?Psychiatric: Mood and affect normal. Behavior is normal. Judgment and thought content normal.  ? ? ?CBC ?   ?Component Value Date/Time  ? WBC 8.4 11/18/2019 0617  ? RBC 3.40 (L) 11/18/2019 0617  ? HGB 9.8 (L) 11/18/2019 0617  ? HGB 12.0 01/30/2014 0000  ? HCT 29.4 (L) 11/18/2019 0617  ? HCT 36 01/30/2014 0000  ? PLT 272 11/18/2019 0617  ? PLT 278 01/30/2014 0000  ? MCV 86.5 11/18/2019 0617  ? MCH 28.8 11/18/2019 0617  ? MCHC 33.3 11/18/2019 0617  ? RDW 13.2 11/18/2019 0617  ? ? ? ?Assessment and Plan: ? ?Screening for Ischemic Heart Disease: ? ?Lipid profile today ? ?Family History of DM: ? ?A1c today ? ?Vitamin D/B12 Deficiency: ? ?Vitamin D and B12 today ? ?RTC in 6 months for your annual exam ?10-05-1998, NP ?This visit occurred during the SARS-CoV-2 public health emergency.  Safety protocols were in place, including screening questions prior to the visit, additional usage of staff PPE, and  extensive cleaning of exam room while observing appropriate contact time as indicated for disinfecting solutions.  ? ?

## 2021-03-26 NOTE — Assessment & Plan Note (Signed)
Encourage diet and exercise for weight loss 

## 2021-03-31 ENCOUNTER — Other Ambulatory Visit: Payer: Self-pay

## 2021-03-31 ENCOUNTER — Other Ambulatory Visit: Payer: Self-pay | Admitting: Internal Medicine

## 2021-03-31 ENCOUNTER — Encounter: Payer: Self-pay | Admitting: Internal Medicine

## 2021-03-31 DIAGNOSIS — N926 Irregular menstruation, unspecified: Secondary | ICD-10-CM

## 2021-03-31 MED ORDER — VITAMIN D (ERGOCALCIFEROL) 1.25 MG (50000 UNIT) PO CAPS: 50000.0000 [IU] | ORAL_CAPSULE | ORAL | 0 refills | Status: AC

## 2021-04-01 ENCOUNTER — Encounter: Payer: Self-pay | Admitting: Internal Medicine

## 2021-04-01 LAB — LIPID PANEL
Cholesterol: 197 mg/dL (ref <200)
HDL: 79 mg/dL (ref >=50)
LDL Cholesterol (Calc): 104 mg/dL (calc) — ABNORMAL HIGH
Non-HDL Cholesterol (Calc): 118 mg/dL (calc) (ref <130)
Total CHOL/HDL Ratio: 2.5 (calc) (ref <5.0)
Triglycerides: 48 mg/dL (ref <150)

## 2021-04-01 LAB — COMPLETE METABOLIC PANEL WITH GFR
AG Ratio: 1.7 (calc) (ref 1.0–2.5)
ALT: 11 U/L (ref 6–29)
AST: 19 U/L (ref 10–30)
Albumin: 4.3 g/dL (ref 3.6–5.1)
Alkaline phosphatase (APISO): 61 U/L (ref 31–125)
BUN: 12 mg/dL (ref 7–25)
CO2: 24 mmol/L (ref 20–32)
Calcium: 9.2 mg/dL (ref 8.6–10.2)
Chloride: 106 mmol/L (ref 98–110)
Creat: 0.71 mg/dL (ref 0.50–0.97)
Globulin: 2.6 g/dL (calc) (ref 1.9–3.7)
Glucose, Bld: 88 mg/dL (ref 65–139)
Potassium: 4 mmol/L (ref 3.5–5.3)
Sodium: 138 mmol/L (ref 135–146)
Total Bilirubin: 0.3 mg/dL (ref 0.2–1.2)
Total Protein: 6.9 g/dL (ref 6.1–8.1)
eGFR: 115 mL/min/{1.73_m2} (ref >=60)

## 2021-04-01 LAB — CBC
HCT: 34.7 % — ABNORMAL LOW (ref 35.0–45.0)
Hemoglobin: 10.9 g/dL — ABNORMAL LOW (ref 11.7–15.5)
MCH: 26.5 pg — ABNORMAL LOW (ref 27.0–33.0)
MCHC: 31.4 g/dL — ABNORMAL LOW (ref 32.0–36.0)
MCV: 84.4 fL (ref 80.0–100.0)
MPV: 10.6 fL (ref 7.5–12.5)
Platelets: 360 10*3/uL (ref 140–400)
RBC: 4.11 10*6/uL (ref 3.80–5.10)
RDW: 15.6 % — ABNORMAL HIGH (ref 11.0–15.0)
WBC: 6 10*3/uL (ref 3.8–10.8)

## 2021-04-01 LAB — ESTROGENS, TOTAL: Estrogen: 326.4 pg/mL

## 2021-04-01 LAB — TSH: TSH: 2.07 mIU/L

## 2021-04-01 LAB — HEMOGLOBIN A1C
Hgb A1c MFr Bld: 5.3 % of total Hgb (ref <5.7)
Mean Plasma Glucose: 105 mg/dL
eAG (mmol/L): 5.8 mmol/L

## 2021-04-01 LAB — VITAMIN D 25 HYDROXY (VIT D DEFICIENCY, FRACTURES): Vit D, 25-Hydroxy: 14 ng/mL — ABNORMAL LOW (ref 30–100)

## 2021-04-01 LAB — TESTOSTERONE, TOTAL, LC/MS/MS: Testosterone, Total, LC-MS-MS: 26 ng/dL (ref 2–45)

## 2021-04-01 LAB — HCG, QUANTITATIVE, PREGNANCY: HCG, Total, QN: <3 m[IU]/mL

## 2021-04-01 LAB — PROGESTERONE: Progesterone: 27.1 ng/mL

## 2021-04-01 LAB — VITAMIN B12: Vitamin B-12: 335 pg/mL (ref 200–1100)

## 2021-04-17 ENCOUNTER — Other Ambulatory Visit: Payer: Self-pay | Admitting: Internal Medicine

## 2021-04-20 NOTE — Telephone Encounter (Signed)
Requested medication (s) are due for refill today: No ? ?Requested medication (s) are on the active medication list: Yes ? ?Last refill:  03/26/21 ? ?Future visit scheduled: No ? ?Notes to clinic:  Pharmacy requests 90 day supply. ? ? ? ?Requested Prescriptions  ?Pending Prescriptions Disp Refills  ? escitalopram (LEXAPRO) 10 MG tablet [Pharmacy Med Name: ESCITALOPRAM 10 MG TABLET] 90 tablet 1  ?  Sig: TAKE 1 TABLET BY MOUTH EVERY DAY  ?  ? Psychiatry:  Antidepressants - SSRI Passed - 04/17/2021  2:32 PM  ?  ?  Passed - Completed PHQ-2 or PHQ-9 in the last 360 days  ?  ?  Passed - Valid encounter within last 6 months  ?  Recent Outpatient Visits   ? ?      ? 3 weeks ago Anxiety and depression  ? [facility] [place], Salvadore Oxford, NP  ? ?  ?  ? ?  ?  ?  ? ?

## 2021-10-19 ENCOUNTER — Other Ambulatory Visit: Payer: Self-pay | Admitting: Internal Medicine

## 2021-10-20 NOTE — Telephone Encounter (Signed)
Pt states psychiatry will take care of Lexapro refill. Pt states she is looking for another PCP and will not be seeing Rollene Fare in future. Offered TOC to Dr. Raliegh Ip. States she would like female.
